# Patient Record
Sex: Female | Born: 1965
Health system: Southern US, Community
[De-identification: ages and names within clinical notes are randomized; demographics above are authoritative.]

## PROBLEM LIST (undated history)

## (undated) DIAGNOSIS — F32A Depression, unspecified: Secondary | ICD-10-CM

## (undated) DIAGNOSIS — L940 Localized scleroderma [morphea]: Secondary | ICD-10-CM

## (undated) DIAGNOSIS — I10 Essential (primary) hypertension: Secondary | ICD-10-CM

## (undated) HISTORY — PX: BREAST REDUCTION SURGERY: SHX8

## (undated) HISTORY — PX: REDUCTION MAMMAPLASTY: SUR839

---

## 2020-05-22 DIAGNOSIS — S61211A Laceration without foreign body of left index finger without damage to nail, initial encounter: Secondary | ICD-10-CM | POA: Diagnosis not present

## 2020-05-28 DIAGNOSIS — S61211A Laceration without foreign body of left index finger without damage to nail, initial encounter: Secondary | ICD-10-CM | POA: Diagnosis not present

## 2020-05-28 DIAGNOSIS — L03012 Cellulitis of left finger: Secondary | ICD-10-CM | POA: Diagnosis not present

## 2020-05-30 ENCOUNTER — Other Ambulatory Visit: Payer: Self-pay

## 2020-05-30 ENCOUNTER — Encounter (HOSPITAL_COMMUNITY): Payer: Self-pay | Admitting: Emergency Medicine

## 2020-05-30 ENCOUNTER — Ambulatory Visit (HOSPITAL_COMMUNITY)
Admission: EM | Admit: 2020-05-30 | Discharge: 2020-05-30 | Disposition: A | Discharge status: Home / Self Care | Payer: BLUE CROSS/BLUE SHIELD

## 2020-05-30 DIAGNOSIS — L089 Local infection of the skin and subcutaneous tissue, unspecified: Secondary | ICD-10-CM

## 2020-05-30 HISTORY — DX: Depression, unspecified: F32.A

## 2020-05-30 HISTORY — DX: Essential (primary) hypertension: I10

## 2020-05-30 HISTORY — DX: Localized scleroderma (morphea): L94.0

## 2020-05-30 MED ORDER — CEFTRIAXONE SODIUM 500 MG IJ SOLR
500.0000 mg | Freq: Once | INTRAMUSCULAR | Status: AC
  Administered 2020-05-30: 500 mg via INTRAMUSCULAR

## 2020-05-30 MED ORDER — AMOXICILLIN-POT CLAVULANATE 875-125 MG PO TABS: 1.0000 | ORAL_TABLET | Freq: Two times a day (BID) | ORAL | 0 refills | Status: DC

## 2020-05-30 MED ORDER — LIDOCAINE HCL (PF) 1 % IJ SOLN
INTRAMUSCULAR | Status: AC
  Filled 2020-05-30: qty 2

## 2020-05-30 MED ORDER — CEFTRIAXONE SODIUM 500 MG IJ SOLR
INTRAMUSCULAR | Status: AC
  Filled 2020-05-30: qty 500

## 2020-05-30 NOTE — ED Notes (Signed)
Left index finger with sutures.  Finger is red, swollen and warm to touch.   Has completed bactrim and now on doxycycline.

## 2020-05-30 NOTE — ED Triage Notes (Signed)
Pt states she had a left index finger laceration on 10/6. She had stitches placed and was put on bactrim. She states she went to another urgent care on Tuesday and was giving doxycycline. She states her finger is draining pus and it is swollen.

## 2020-05-30 NOTE — ED Provider Notes (Signed)
[facility]    CSN: [id number] Arrival date & time: 05/30/20  1012      History   Chief Complaint Chief Complaint  Patient presents with  . finger infection    HPI Sherry Hansen is a 54 y.o. female.   Patient is a 54 year old female who presents today for left index finger infection.  Had laceration on 10/6 and had stitches.  Was placed on Bactrim and finished full course of that.  She has had continued and worsening redness, swelling.  She has had purulent drainage from the site.  Tuesday went to another urgent care was started on doxycycline but has seen no improvement.  No fevers, chills.     Past Medical History:  Diagnosis Date  . Depression   . Hypertension   . Scleroderma, localized     There are no problems to display for this patient.   Past Surgical History:  Procedure Laterality Date  . BREAST REDUCTION SURGERY      OB History   No obstetric history on file.      Home Medications    Prior to Admission medications   Medication Sig Start Date End Date Taking? Authorizing Provider  amLODipine (NORVASC) 10 MG tablet Take 10 mg by mouth daily.   Yes [provider]  busPIRone (BUSPAR) 30 MG tablet Take 45 mg by mouth in the morning and at bedtime.   Yes [provider]  estradiol (CLIMARA - DOSED IN MG/24 HR) 0.1 mg/24hr patch Place 0.1 mg onto the skin once a week.   Yes [provider]  loratadine (CLARITIN) 10 MG tablet Take 10 mg by mouth daily.   Yes [provider]  Venlafaxine HCl 150 MG TB24 Take 300 mg by mouth daily.   Yes [provider]  amoxicillin-clavulanate (AUGMENTIN) 875-125 MG tablet Take 1 tablet by mouth every 12 (twelve) hours. 05/30/20   Janace Aris, NP    Family History No family history on file.  Social History Social History   Tobacco Use  . Smoking status: Never Smoker  . Smokeless tobacco: Never Used  Vaping Use  . Vaping Use: Never used    Substance Use Topics  . Alcohol use: Not Currently  . Drug use: Not on file     Allergies   Nsaids   Review of Systems Review of Systems   Physical Exam Triage Vital Signs ED Triage Vitals  Enc Vitals Group     BP 05/30/20 1051 (!) 150/94     Pulse Rate 05/30/20 1050 94     Resp 05/30/20 1050 (!) 21     Temp 05/30/20 1050 98.4 F (36.9 C)     Temp Source 05/30/20 1050 Oral     SpO2 05/30/20 1050 99 %     Weight --      Height --      Head Circumference --      Peak Flow --      Pain Score 05/30/20 1051 0     Pain Loc --      Pain Edu? --      Excl. in GC? --    No data found.  Updated Vital Signs BP (!) 150/94   Pulse 94   Temp 98.4 F (36.9 C) (Oral)   Resp (!) 21   SpO2 99%   Visual Acuity Right Eye Distance:   Left Eye Distance:   Bilateral Distance:    Right Eye Near:   Left Eye  Near:    Bilateral Near:     Physical Exam Vitals and nursing note reviewed.  Constitutional:      General: She is not in acute distress.    Appearance: Normal appearance. She is not ill-appearing, toxic-appearing or diaphoretic.  HENT:     Head: Normocephalic.     Nose: Nose normal.  Eyes:     Conjunctiva/sclera: Conjunctivae normal.  Pulmonary:     Effort: Pulmonary effort is normal.  Musculoskeletal:        General: Normal range of motion.     Cervical back: Normal range of motion.     Comments: Left index finger laceration with stitches in place.  There is significant redness, swelling to the finger with purulent drainage from incision Limited range of motion tender to touch.  Skin:    General: Skin is warm and dry.     Findings: No rash.  Neurological:     Mental Status: She is alert.  Psychiatric:        Mood and Affect: Mood normal.      UC Treatments / Results  Labs (all labs ordered are listed, but only abnormal results are displayed) Labs Reviewed - No data to display  EKG   Radiology No results found.  Procedures Procedures  (including critical care time)  Medications Ordered in UC Medications  cefTRIAXone (ROCEPHIN) injection 500 mg (500 mg Intramuscular Given 05/30/20 1141)    Initial Impression / Assessment and Plan / UC Course  I have reviewed the triage vital signs and the nursing notes.  Pertinent labs & imaging results that were available during my care of the patient were reviewed by me and considered in my medical decision making (see chart for details).     Finger laceration and infection Patient with active infection.  Patient already been treated with 1 week of Bactrim and has had 2 doses of doxycycline and symptoms are not improved at all. Will trial amoxicillin and Rocephin injection here in clinic for better strep coverage Close monitoring over the next 24 hours.  Follow up if not better.   Final Clinical Impressions(s) / UC Diagnoses   Final diagnoses:  Finger infection  Wound infection     Discharge Instructions     Medicine as prescribed  Follow up as needed for continued or worsening symptoms     ED Prescriptions    Medication Sig Dispense Auth. Provider   amoxicillin-clavulanate (AUGMENTIN) 875-125 MG tablet Take 1 tablet by mouth every 12 (twelve) hours. 14 tablet Analea Muller A, NP     PDMP not reviewed this encounter.   Janace Aris, NP 05/31/20 [phone]

## 2020-05-30 NOTE — Discharge Instructions (Addendum)
Medicine as prescribed °Follow up as needed for continued or worsening symptoms ° °

## 2020-06-05 ENCOUNTER — Ambulatory Visit: Payer: Self-pay | Admitting: *Deleted

## 2020-06-05 NOTE — Telephone Encounter (Signed)
1st finger on left hand had a cut from just over 1 month ago. Initially required 5 stitches.Was given antibiotic Amoxil 875-125 mg on 05/30/20 at the UC and stitches were removed at that time.   Swelling is reduced 75% today. Still red where the stitches were removed but no further out. Edges are closed with scabbing and no drainage.Somewhat warm to touch. No systemic fever. Completes Amoxil 875-125 mg tonight. Symptoms requiring additional evaluation discussed.Advised to complete abx and keep area clean and dry. Provided pcp office and number for future new patient appointment.

## 2020-06-12 ENCOUNTER — Ambulatory Visit (HOSPITAL_COMMUNITY)
Admission: EM | Admit: 2020-06-12 | Discharge: 2020-06-12 | Disposition: A | Discharge status: Home / Self Care | Payer: BLUE CROSS/BLUE SHIELD | Attending: Family Medicine | Admitting: Family Medicine

## 2020-06-12 ENCOUNTER — Encounter (HOSPITAL_COMMUNITY): Payer: Self-pay | Admitting: Emergency Medicine

## 2020-06-12 ENCOUNTER — Other Ambulatory Visit: Payer: Self-pay

## 2020-06-12 DIAGNOSIS — N649 Disorder of breast, unspecified: Secondary | ICD-10-CM | POA: Diagnosis not present

## 2020-06-12 DIAGNOSIS — L988 Other specified disorders of the skin and subcutaneous tissue: Secondary | ICD-10-CM

## 2020-06-12 MED ORDER — NYSTATIN 100000 UNIT/GM EX CREA: TOPICAL_CREAM | CUTANEOUS | 0 refills | Status: DC

## 2020-06-12 MED ORDER — BACITRACIN ZINC 500 UNIT/GM EX OINT
TOPICAL_OINTMENT | CUTANEOUS | Status: AC
  Filled 2020-06-12: qty 0.9

## 2020-06-12 MED ORDER — AMOXICILLIN-POT CLAVULANATE 875-125 MG PO TABS: 1.0000 | ORAL_TABLET | Freq: Two times a day (BID) | ORAL | 0 refills | Status: DC

## 2020-06-12 NOTE — ED Triage Notes (Signed)
Pt states she had an irritation under her left breast x 1 week. Pt states she just finished her antibiotics about a week ago and is concerned it may have come from the antibiotics.

## 2020-06-12 NOTE — ED Provider Notes (Signed)
[facility]    CSN: [id number] Arrival date & time: 06/12/20  1025      History   Chief Complaint Chief Complaint  Patient presents with  . Rash    HPI Sherry Hansen is a 54 y.o. female.   Patient presenting today with several days of worsening blistering and irritation under breasts, worst under right breast. Notes she was just on 4 antibiotics the past month or so for a finger infection but otherwise has done nothing out of the ordinary prior to onset. Mild soreness and itching to area, no swelling, drainage, fever, chills. Hx of scleroderma so lots of scarring in this area already. Trying to keep it clean and dry but otherwise not trying anything OTC for it.      Past Medical History:  Diagnosis Date  . Depression   . Hypertension   . Scleroderma, localized     There are no problems to display for this patient.   Past Surgical History:  Procedure Laterality Date  . BREAST REDUCTION SURGERY      OB History   No obstetric history on file.      Home Medications    Prior to Admission medications   Medication Sig Start Date End Date Taking? Authorizing Provider  amLODipine (NORVASC) 10 MG tablet Take 10 mg by mouth daily.   Yes [provider]  busPIRone (BUSPAR) 30 MG tablet Take 45 mg by mouth in the morning and at bedtime.   Yes [provider]  estradiol (CLIMARA - DOSED IN MG/24 HR) 0.1 mg/24hr patch Place 0.1 mg onto the skin once a week.   Yes [provider]  loratadine (CLARITIN) 10 MG tablet Take 10 mg by mouth daily.   Yes [provider]  Venlafaxine HCl 150 MG TB24 Take 300 mg by mouth daily.   Yes [provider]  amoxicillin-clavulanate (AUGMENTIN) 875-125 MG tablet Take 1 tablet by mouth every 12 (twelve) hours. 06/12/20   Particia Nearing, PA-C  nystatin cream (MYCOSTATIN) Apply to affected area 2 times daily 06/12/20   Particia Nearing, PA-C    Family  History History reviewed. No pertinent family history.  Social History Social History   Tobacco Use  . Smoking status: Never Smoker  . Smokeless tobacco: Never Used  Vaping Use  . Vaping Use: Never used  Substance Use Topics  . Alcohol use: Not Currently  . Drug use: Not on file     Allergies   Nsaids   Review of Systems Review of Systems PER HPI    Physical Exam Triage Vital Signs ED Triage Vitals  Enc Vitals Group     BP 06/12/20 1125 (!) 155/103     Pulse Rate 06/12/20 1125 93     Resp 06/12/20 1125 (!) 21     Temp 06/12/20 1125 98.4 F (36.9 C)     Temp Source 06/12/20 1125 Oral     SpO2 06/12/20 1125 100 %     Weight --      Height --      Head Circumference --      Peak Flow --      Pain Score 06/12/20 1122 2     Pain Loc --      Pain Edu? --      Excl. in GC? --    No data found.  Updated Vital Signs BP (!) 143/89 (BP Location: Right Arm)   Pulse 93   Temp 98.4 F (36.9  C) (Oral)   Resp (!) 21   SpO2 100%   Visual Acuity Right Eye Distance:   Left Eye Distance:   Bilateral Distance:    Right Eye Near:   Left Eye Near:    Bilateral Near:     Physical Exam Vitals and nursing note reviewed.  Constitutional:      Appearance: Normal appearance. She is not ill-appearing.  HENT:     Head: Atraumatic.  Eyes:     Extraocular Movements: Extraocular movements intact.     Conjunctiva/sclera: Conjunctivae normal.  Cardiovascular:     Rate and Rhythm: Normal rate and regular rhythm.     Heart sounds: Normal heart sounds.  Pulmonary:     Effort: Pulmonary effort is normal.     Breath sounds: Normal breath sounds.  Musculoskeletal:        General: Normal range of motion.     Cervical back: Normal range of motion and neck supple.  Skin:    General: Skin is warm.     Comments: Large patches of scarring from scleroderma, and then some areas of early blistering/irritation under b/l breasts with some superficial ulcerations forming under right  breast with granulation tissue present in centers  Neurological:     Mental Status: She is alert and oriented to person, place, and time.  Psychiatric:        Mood and Affect: Mood normal.        Thought Content: Thought content normal.        Judgment: Judgment normal.    UC Treatments / Results  Labs (all labs ordered are listed, but only abnormal results are displayed) Labs Reviewed - No data to display  EKG   Radiology No results found.  Procedures Procedures (including critical care time)  Medications Ordered in UC Medications - No data to display  Initial Impression / Assessment and Plan / UC Course  I have reviewed the triage vital signs and the nursing notes.  Pertinent labs & imaging results that were available during my care of the patient were reviewed by me and considered in my medical decision making (see chart for details).     Will tx with neosporin and nystatin cream, keep clean and covered. Tegaderm and nonstick gauze used to dress ulcerations today in clinic. If ulcerations worsening may start augmentin though hoping to avoid that as she has recently been on 4 different antibiotics for a finger infection. Return precautions given.  Final Clinical Impressions(s) / UC Diagnoses   Final diagnoses:  Skin lesion of breast   Discharge Instructions   None    ED Prescriptions    Medication Sig Dispense Auth. Provider   amoxicillin-clavulanate (AUGMENTIN) 875-125 MG tablet Take 1 tablet by mouth every 12 (twelve) hours. 14 tablet Roosvelt Maser [place], [place]   nystatin cream (MYCOSTATIN) Apply to affected area 2 times daily 30 g Particia Nearing, [place]     PDMP not reviewed this encounter.   Particia Nearing, [place] 06/12/20 2143

## 2020-07-23 ENCOUNTER — Ambulatory Visit
Admission: EM | Admit: 2020-07-23 | Discharge: 2020-07-23 | Disposition: A | Discharge status: Home / Self Care | Payer: BLUE CROSS/BLUE SHIELD | Attending: Internal Medicine | Admitting: Internal Medicine

## 2020-07-23 ENCOUNTER — Other Ambulatory Visit: Payer: Self-pay

## 2020-07-23 DIAGNOSIS — N898 Other specified noninflammatory disorders of vagina: Secondary | ICD-10-CM | POA: Diagnosis not present

## 2020-07-23 DIAGNOSIS — Z79899 Other long term (current) drug therapy: Secondary | ICD-10-CM | POA: Insufficient documentation

## 2020-07-23 DIAGNOSIS — N760 Acute vaginitis: Secondary | ICD-10-CM | POA: Insufficient documentation

## 2020-07-23 LAB — POCT URINALYSIS DIP (MANUAL ENTRY)
Bilirubin, UA: NEGATIVE
Glucose, UA: NEGATIVE mg/dL
Ketones, POC UA: NEGATIVE mg/dL
Nitrite, UA: NEGATIVE
Protein Ur, POC: NEGATIVE mg/dL
Spec Grav, UA: 1.015 (ref 1.010–1.025)
Urobilinogen, UA: 0.2 E.U./dL
pH, UA: 6 (ref 5.0–8.0)

## 2020-07-23 MED ORDER — FLUCONAZOLE 150 MG PO TABS: 150.0000 mg | ORAL_TABLET | Freq: Once | ORAL | 0 refills | Status: AC

## 2020-07-23 NOTE — ED Provider Notes (Signed)
[facility]    CSN: [id number] Arrival date & time: 07/23/20  1410      History   Chief Complaint No chief complaint on file.   HPI Sherry Hansen is a 54 y.o. female comes to the urgent care with complaints of vaginal burning over the past few days.  Patient was recently treated for vaginal yeast infection with Monistat.  Patient had partial relief after using the Monistat.  She started complaining about vaginal burning a few days ago.  Is associated with some frequency of urination but no significant dysuria.  Patient denies any lower abdominal pain.  No vaginal discharge noted.  No odor noted.  Patient denied any rash or itching in the vaginal area.  She is not sexually active at this time.   HPI  Past Medical History:  Diagnosis Date  . Depression   . Hypertension   . Scleroderma, localized     There are no problems to display for this patient.   Past Surgical History:  Procedure Laterality Date  . BREAST REDUCTION SURGERY      OB History   No obstetric history on file.      Home Medications    Prior to Admission medications   Medication Sig Start Date End Date Taking? Authorizing Provider  amLODipine (NORVASC) 10 MG tablet Take 10 mg by mouth daily.    [provider]  busPIRone (BUSPAR) 30 MG tablet Take 45 mg by mouth in the morning and at bedtime.    [provider]  estradiol (CLIMARA - DOSED IN MG/24 HR) 0.1 mg/24hr patch Place 0.1 mg onto the skin once a week.    [provider]  fluconazole (DIFLUCAN) 150 MG tablet Take 1 tablet (150 mg total) by mouth once for 1 dose. Please take the second dose after 72 hours if no improvement in symptoms 07/23/20 07/23/20  Merrilee Jansky, MD  loratadine (CLARITIN) 10 MG tablet Take 10 mg by mouth daily.    [provider]  nystatin cream (MYCOSTATIN) Apply to affected area 2 times daily 06/12/20   Particia Nearing, PA-C  Venlafaxine HCl 150 MG TB24 Take  300 mg by mouth daily.    [provider]    Family History Family History  Problem Relation Age of Onset  . Cancer Father     Social History Social History   Tobacco Use  . Smoking status: Never Smoker  . Smokeless tobacco: Never Used  Vaping Use  . Vaping Use: Never used  Substance Use Topics  . Alcohol use: Not Currently  . Drug use: Not on file     Allergies   Nsaids   Review of Systems Review of Systems  Constitutional: Negative.   HENT: Negative.   Gastrointestinal: Negative.  Negative for abdominal pain.  Genitourinary: Positive for frequency, urgency and vaginal pain. Negative for dysuria, vaginal bleeding and vaginal discharge.  Musculoskeletal: Negative.   Neurological: Negative.      Physical Exam Triage Vital Signs ED Triage Vitals  Enc Vitals Group     BP 07/23/20 1520 (!) 156/106     Pulse Rate 07/23/20 1520 96     Resp 07/23/20 1520 20     Temp 07/23/20 1520 97.9 F (36.6 C)     Temp src --      SpO2 07/23/20 1520 97 %     Weight --      Height --      Head Circumference --  Peak Flow --      Pain Score 07/23/20 1518 0     Pain Loc --      Pain Edu? --      Excl. in GC? --    No data found.  Updated Vital Signs BP (!) 156/106   Pulse 96   Temp 97.9 F (36.6 C)   Resp 20   SpO2 97%   Visual Acuity Right Eye Distance:   Left Eye Distance:   Bilateral Distance:    Right Eye Near:   Left Eye Near:    Bilateral Near:     Physical Exam Vitals and nursing note reviewed.  Constitutional:      General: She is not in acute distress.    Appearance: She is not ill-appearing.  Cardiovascular:     Rate and Rhythm: Normal rate and regular rhythm.     Pulses: Normal pulses.     Heart sounds: Normal heart sounds.  Pulmonary:     Effort: Pulmonary effort is normal.     Breath sounds: Normal breath sounds.  Abdominal:     General: Bowel sounds are normal.     Palpations: Abdomen is soft.  Neurological:     Mental  Status: She is alert.      UC Treatments / Results  Labs (all labs ordered are listed, but only abnormal results are displayed) Labs Reviewed  POCT URINALYSIS DIP (MANUAL ENTRY) - Abnormal; Notable for the following components:      Result Value   Blood, UA small (*)    Leukocytes, UA Small (1+) (*)    All other components within normal limits  URINE CULTURE  CERVICOVAGINAL ANCILLARY ONLY    EKG   Radiology No results found.  Procedures Procedures (including critical care time)  Medications Ordered in UC Medications - No data to display  Initial Impression / Assessment and Plan / UC Course  I have reviewed the triage vital signs and the nursing notes.  Pertinent labs & imaging results that were available during my care of the patient were reviewed by me and considered in my medical decision making (see chart for details).     1.  Acute vaginitis Point-of-care urinalysis showed small hemoglobin and leukocyte esterase Cervicovaginal swab for BV and yeast Fluconazole 150 mg orally x1 dose Treatment regimen will be updated if the cervicovaginal swab is abnormal. Return precautions given Final Clinical Impressions(s) / UC Diagnoses   Final diagnoses:  Vaginitis and vulvovaginitis     Discharge Instructions     Please sign up for MyChart to review your results If your labs are abnormal we will call you with recommendations.    ED Prescriptions    Medication Sig Dispense Auth. Provider   fluconazole (DIFLUCAN) 150 MG tablet Take 1 tablet (150 mg total) by mouth once for 1 dose. Please take the second dose after 72 hours if no improvement in symptoms 2 tablet Lamptey, Britta Mccreedy, MD     PDMP not reviewed this encounter.   Merrilee Jansky, MD 07/23/20 [phone]

## 2020-07-23 NOTE — Discharge Instructions (Signed)
Please sign up for MyChart to review your results If your labs are abnormal we will call you with recommendations.

## 2020-07-23 NOTE — ED Triage Notes (Signed)
Pt states she usually has frequent yeast infections however she wants to make sure irritation isn't coming from uti , pt states she has used over the counter medication for yeast with no relief

## 2020-07-25 LAB — CERVICOVAGINAL ANCILLARY ONLY
Bacterial Vaginitis (gardnerella): NEGATIVE
Candida Glabrata: NEGATIVE
Candida Vaginitis: NEGATIVE
Comment: NEGATIVE
Comment: NEGATIVE
Comment: NEGATIVE

## 2020-07-25 LAB — URINE CULTURE: Culture: >=100000 — AB

## 2020-08-21 ENCOUNTER — Ambulatory Visit: Payer: BLUE CROSS/BLUE SHIELD | Admitting: Nurse Practitioner

## 2020-11-28 ENCOUNTER — Ambulatory Visit (HOSPITAL_COMMUNITY)
Admission: EM | Admit: 2020-11-28 | Discharge: 2020-11-28 | Disposition: A | Discharge status: Home / Self Care | Payer: BLUE CROSS/BLUE SHIELD | Attending: Family Medicine | Admitting: Family Medicine

## 2020-11-28 ENCOUNTER — Encounter (HOSPITAL_COMMUNITY): Payer: Self-pay

## 2020-11-28 ENCOUNTER — Other Ambulatory Visit: Payer: Self-pay

## 2020-11-28 DIAGNOSIS — N611 Abscess of the breast and nipple: Secondary | ICD-10-CM

## 2020-11-28 DIAGNOSIS — L239 Allergic contact dermatitis, unspecified cause: Secondary | ICD-10-CM | POA: Diagnosis not present

## 2020-11-28 MED ORDER — DOXYCYCLINE HYCLATE 100 MG PO TABS: 100.0000 mg | ORAL_TABLET | Freq: Two times a day (BID) | ORAL | 0 refills | Status: DC

## 2020-11-28 MED ORDER — TRIAMCINOLONE ACETONIDE 0.1 % EX CREA: 1.0000 "application " | TOPICAL_CREAM | Freq: Two times a day (BID) | CUTANEOUS | 0 refills | Status: DC

## 2020-11-28 MED ORDER — HIBICLENS 4 % EX LIQD: Freq: Every day | CUTANEOUS | 0 refills | Status: DC | PRN

## 2020-11-28 NOTE — ED Triage Notes (Signed)
Pt presents with itchiness, redness and pain in the left breast x 3 weeks. Reports having discharge from the sore.

## 2020-11-28 NOTE — ED Provider Notes (Signed)
[facility]    CSN: [id number] Arrival date & time: 11/28/20  1031      History   Chief Complaint Chief Complaint  Patient presents with  . Recurrent Skin Infections    HPI Sherry Hansen is a 55 y.o. female.   Patient presenting today with 3-week history of worsening draining abscess left breast.  She states she has been treating with Neosporin, wound cleaner, keeping covered with Tegaderm and thought it was getting better at first but now it is gone significantly worse in the last 3 days.  Draining thick yellow discharge.  Denies fever, chills, body aches, sweats.  Also having itchy rash where the Tegaderm adhesive has been reacting to her skin surrounding the wound.  History of similar issue on the opposite side last year.  Does have history of scleroderma.     Past Medical History:  Diagnosis Date  . Depression   . Hypertension   . Scleroderma, localized     There are no problems to display for this patient.   Past Surgical History:  Procedure Laterality Date  . BREAST REDUCTION SURGERY      OB History   No obstetric history on file.      Home Medications    Prior to Admission medications   Medication Sig Start Date End Date Taking? Authorizing Provider  chlorhexidine (HIBICLENS) 4 % external liquid Apply topically daily as needed. 11/28/20  Yes Particia Nearing, PA-C  doxycycline (VIBRA-TABS) 100 MG tablet Take 1 tablet (100 mg total) by mouth 2 (two) times daily. 11/28/20  Yes Particia Nearing, PA-C  triamcinolone cream (KENALOG) 0.1 % Apply 1 application topically 2 (two) times daily. 11/28/20  Yes Particia Nearing, PA-C  amLODipine (NORVASC) 10 MG tablet Take 10 mg by mouth daily.    [provider]  busPIRone (BUSPAR) 30 MG tablet Take 45 mg by mouth in the morning and at bedtime.    [provider]  estradiol (CLIMARA - DOSED IN MG/24 HR) 0.1 mg/24hr patch Place 0.1 mg onto the skin once a week.     [provider]  loratadine (CLARITIN) 10 MG tablet Take 10 mg by mouth daily.    [provider]  nystatin cream (MYCOSTATIN) Apply to affected area 2 times daily 06/12/20   Particia Nearing, PA-C  Venlafaxine HCl 150 MG TB24 Take 300 mg by mouth daily.    [provider]    Family History Family History  Problem Relation Age of Onset  . Cancer Father     Social History Social History   Tobacco Use  . Smoking status: Never Smoker  . Smokeless tobacco: Never Used  Vaping Use  . Vaping Use: Never used  Substance Use Topics  . Alcohol use: Not Currently     Allergies   Nsaids   Review of Systems Review of Systems Per HPI Physical Exam Triage Vital Signs ED Triage Vitals [11/28/20 1146]  Enc Vitals Group     BP (!) 152/99     Pulse Rate 91     Resp 20     Temp 98.9 F (37.2 C)     Temp Source Oral     SpO2 100 %     Weight      Height      Head Circumference      Peak Flow      Pain Score      Pain Loc      Pain Edu?  Excl. in GC?    No data found.  Updated Vital Signs BP (!) 152/99 (BP Location: Right Arm)   Pulse 91   Temp 98.9 F (37.2 C) (Oral)   Resp 20   SpO2 100%   Visual Acuity Right Eye Distance:   Left Eye Distance:   Bilateral Distance:    Right Eye Near:   Left Eye Near:    Bilateral Near:     Physical Exam Vitals and nursing note reviewed.  Constitutional:      Appearance: Normal appearance. She is not ill-appearing.  HENT:     Head: Atraumatic.     Nose: Nose normal.     Mouth/Throat:     Mouth: Mucous membranes are moist.     Pharynx: Oropharynx is clear.  Eyes:     Extraocular Movements: Extraocular movements intact.     Conjunctiva/sclera: Conjunctivae normal.  Cardiovascular:     Rate and Rhythm: Normal rate and regular rhythm.     Heart sounds: Normal heart sounds.  Pulmonary:     Effort: Pulmonary effort is normal.     Breath sounds: Normal breath sounds.  Musculoskeletal:         General: Normal range of motion.     Cervical back: Normal range of motion and neck supple.  Skin:    General: Skin is warm.     Comments: 1 cm x 2 cm oblong firm spontaneously draining abscess left inferior lateral breast Erythematous maculopapular rash along periphery where Tegaderm adhesive has been  Neurological:     Mental Status: She is alert and oriented to person, place, and time.  Psychiatric:        Mood and Affect: Mood normal.        Thought Content: Thought content normal.        Judgment: Judgment normal.      UC Treatments / Results  Labs (all labs ordered are listed, but only abnormal results are displayed) Labs Reviewed - No data to display  EKG   Radiology No results found.  Procedures Procedures (including critical care time)  Medications Ordered in UC Medications - No data to display  Initial Impression / Assessment and Plan / UC Course  I have reviewed the triage vital signs and the nursing notes.  Pertinent labs & imaging results that were available during my care of the patient were reviewed by me and considered in my medical decision making (see chart for details).     Area spontaneously draining and otherwise firm so we will start 10-day once course of doxycycline, Hibiclens, keep covered with Neosporin.  We will also give triamcinolone for allergic dermatitis surrounding wound where the adhesive has reacted with her skin.  Return for wound check if not fully resolving.  Dates she is establishing care with primary care next week and will let them recheck it as well.  Final Clinical Impressions(s) / UC Diagnoses   Final diagnoses:  Breast abscess  Allergic dermatitis   Discharge Instructions   None    ED Prescriptions    Medication Sig Dispense Auth. Provider   doxycycline (VIBRA-TABS) 100 MG tablet Take 1 tablet (100 mg total) by mouth 2 (two) times daily. 20 tablet Particia Nearing, [place]   chlorhexidine (HIBICLENS) 4 %  external liquid Apply topically daily as needed. 120 mL Particia Nearing, [place]   triamcinolone cream (KENALOG) 0.1 % Apply 1 application topically 2 (two) times daily. 100 g Particia Nearing, [place]  PDMP not reviewed this encounter.   Particia Nearing, [place] 11/28/20 1222

## 2020-12-04 ENCOUNTER — Encounter: Payer: Self-pay | Admitting: Nurse Practitioner

## 2020-12-04 ENCOUNTER — Other Ambulatory Visit: Payer: Self-pay | Admitting: Nurse Practitioner

## 2020-12-04 ENCOUNTER — Other Ambulatory Visit: Payer: Self-pay

## 2020-12-04 ENCOUNTER — Ambulatory Visit (INDEPENDENT_AMBULATORY_CARE_PROVIDER_SITE_OTHER): Payer: BLUE CROSS/BLUE SHIELD | Admitting: Nurse Practitioner

## 2020-12-04 VITALS — BP 130/90 | HR 107 | Temp 98.1°F | Ht 63.75 in | Wt 233.8 lb

## 2020-12-04 DIAGNOSIS — N6459 Other signs and symptoms in breast: Secondary | ICD-10-CM

## 2020-12-04 DIAGNOSIS — F411 Generalized anxiety disorder: Secondary | ICD-10-CM

## 2020-12-04 DIAGNOSIS — Z13220 Encounter for screening for lipoid disorders: Secondary | ICD-10-CM | POA: Diagnosis not present

## 2020-12-04 DIAGNOSIS — Z780 Asymptomatic menopausal state: Secondary | ICD-10-CM

## 2020-12-04 DIAGNOSIS — Z136 Encounter for screening for cardiovascular disorders: Secondary | ICD-10-CM

## 2020-12-04 DIAGNOSIS — I10 Essential (primary) hypertension: Secondary | ICD-10-CM | POA: Insufficient documentation

## 2020-12-04 DIAGNOSIS — Z1231 Encounter for screening mammogram for malignant neoplasm of breast: Secondary | ICD-10-CM

## 2020-12-04 MED ORDER — BUSPIRONE HCL 30 MG PO TABS: 45.0000 mg | ORAL_TABLET | Freq: Two times a day (BID) | ORAL | 0 refills | Status: DC

## 2020-12-04 MED ORDER — AMLODIPINE BESYLATE 10 MG PO TABS: 10.0000 mg | ORAL_TABLET | Freq: Every day | ORAL | 0 refills | Status: DC

## 2020-12-04 MED ORDER — VENLAFAXINE HCL ER 150 MG PO TB24: 300.0000 mg | ORAL_TABLET | Freq: Every day | ORAL | 0 refills | Status: DC

## 2020-12-04 MED ORDER — ESTRADIOL 0.1 MG/24HR TD PTWK: 0.1000 mg | MEDICATED_PATCH | TRANSDERMAL | 0 refills | Status: DC

## 2020-12-04 NOTE — Patient Instructions (Addendum)
Sign medical release forms to get records from previous pcp and GYN.  Go to [address] for fasting blood draw You need to be fasting 6-8hrs prior to blood draw, ok to drink water.  You will be contacted to schedule appt for repeat mammogram  Thank you for choosing [facility]

## 2020-12-04 NOTE — Assessment & Plan Note (Signed)
Reports hx of scleroderma: diagnosed by pcp in [place] and dermatology in [place] via blood draw and presentation. No previous skin biopsy. Has scarring under her breast and abdominal wall. No SOB or joint pain or muscle pain or digital ulceration or joint deformity She is requesting for referral to [facility]: Dr. Durenda Age  Advised her about need for previous records from pcp and dermatology prior to rheumatology referral. She is to sign medical release form today. Entered urgent dermatology.

## 2020-12-04 NOTE — Progress Notes (Signed)
Subjective:  Patient ID: Sherry Hansen, female    DOB: 1966/03/25  Age: 55 y.o. MRN: [id number]  CC: Establish Care (NP here for CPE.  Pt not UTD on mammogram or colonoscopy.  Pt is UTD on pap, 12/20 and also on Tdap.  Pt is not fasting for labs today, she would like to discuss last visit to UC, from last Thursday.  Pt will sign a ROR when checking out today.)  HPI  Thickening of skin of breast Reports hx of scleroderma: diagnosed by pcp in [place] and dermatology in [place] via blood draw and presentation. No previous skin biopsy. Has scarring under her breast and abdominal wall. No SOB or joint pain or muscle pain or digital ulceration or joint deformity She is requesting for referral to [facility]: Dr. Durenda Age  Advised her about need for previous records from pcp and dermatology prior to rheumatology referral. She is to sign medical release form today. Entered urgent dermatology.   GAD (generalized anxiety disorder) Stable with effexor and buspar. Refill sent  Primary hypertension BP at goal with amlodipine Refill sent. BP Readings from Last 3 Encounters:  12/04/20 130/90  11/28/20 (!) 152/99  07/23/20 (!) 156/106    Postmenopausal estrogen deficiency Current use of estrogen patch in last 32yrs Per patient: menopausal at age 52 FHx of breast cancer: maternal aunts (42s) and 1st cousin (30s). No previous genetic testing Reports last PAP 1.71yrs ago (normal, no hx of abnormal) She is overdue for annual mammogram: order entered today. Advised to sign medical release form to get records from previous GYN. She understands the risk of cardiovascular event, DVT/PE, and cancer (breast and ovarian)    Reviewed past Medical, Social and Family history today.  Outpatient Medications Prior to Visit  Medication Sig Dispense Refill  . chlorhexidine (HIBICLENS) 4 % external liquid Apply topically daily as needed. 120 mL 0  . doxycycline (VIBRA-TABS) 100 MG  tablet Take 1 tablet (100 mg total) by mouth 2 (two) times daily. 20 tablet 0  . loratadine (CLARITIN) 10 MG tablet Take 10 mg by mouth daily.    Marland Kitchen nystatin cream (MYCOSTATIN) Apply to affected area 2 times daily 30 g 0  . triamcinolone cream (KENALOG) 0.1 % Apply 1 application topically 2 (two) times daily. 100 g 0  . amLODipine (NORVASC) 10 MG tablet Take 10 mg by mouth daily.    . busPIRone (BUSPAR) 30 MG tablet Take 45 mg by mouth in the morning and at bedtime.    Marland Kitchen estradiol (CLIMARA - DOSED IN MG/24 HR) 0.1 mg/24hr patch Place 0.1 mg onto the skin once a week.    . Venlafaxine HCl 150 MG TB24 Take 300 mg by mouth daily.     No facility-administered medications prior to visit.    ROS See HPI  Objective:  BP 130/90 (BP Location: Left Arm, Patient Position: Sitting, Cuff Size: Normal)   Pulse (!) 107   Temp 98.1 F (36.7 C) (Oral)   Ht 5' 3.75" (1.619 m)   Wt 233 lb 12.8 oz (106.1 kg)   SpO2 97%   BMI 40.45 kg/m   Physical Exam Vitals reviewed.  Cardiovascular:     Rate and Rhythm: Normal rate and regular rhythm.     Pulses: Normal pulses.     Heart sounds: Normal heart sounds.  Pulmonary:     Effort: Pulmonary effort is normal.     Breath sounds: Normal breath sounds.  Skin:    Findings: No erythema.  Neurological:     Mental Status: She is alert and oriented to person, place, and time.    Assessment & Plan:  This visit occurred during the SARS-CoV-2 public health emergency.  Safety protocols were in place, including screening questions prior to the visit, additional usage of staff PPE, and extensive cleaning of exam room while observing appropriate contact time as indicated for disinfecting solutions.   Lenix was seen today for establish care.  Diagnoses and all orders for this visit:  Thickening of skin of breast -     CBC with Differential/Platelet; Future -     Comprehensive metabolic panel; Future -     Ambulatory referral to  Dermatology  Encounter for lipid screening for cardiovascular disease -     Lipid panel; Future  GAD (generalized anxiety disorder) -     TSH; Future -     busPIRone (BUSPAR) 30 MG tablet; Take 1.5 tablets (45 mg total) by mouth in the morning and at bedtime. -     Venlafaxine HCl 150 MG TB24; Take 2 tablets (300 mg total) by mouth daily.  Breast cancer screening by mammogram -     MM 3D SCREEN BREAST BILATERAL; Future  Primary hypertension -     CBC with Differential/Platelet; Future -     Comprehensive metabolic panel; Future -     TSH; Future -     amLODipine (NORVASC) 10 MG tablet; Take 1 tablet (10 mg total) by mouth daily.  Postmenopausal estrogen deficiency -     estradiol (CLIMARA - DOSED IN MG/24 HR) 0.1 mg/24hr patch; Place 1 patch (0.1 mg total) onto the skin once a week.   Problem List Items Addressed This Visit      Cardiovascular and Mediastinum   Primary hypertension    BP at goal with amlodipine Refill sent. BP Readings from Last 3 Encounters:  12/04/20 130/90  11/28/20 (!) 152/99  07/23/20 (!) 156/106        Relevant Medications   amLODipine (NORVASC) 10 MG tablet   Other Relevant Orders   CBC with Differential/Platelet   Comprehensive metabolic panel   TSH     Musculoskeletal and Integument   Thickening of skin of breast - Primary    Reports hx of scleroderma: diagnosed by pcp in [place] and dermatology in [place] via blood draw and presentation. No previous skin biopsy. Has scarring under her breast and abdominal wall. No SOB or joint pain or muscle pain or digital ulceration or joint deformity She is requesting for referral to [facility]: Dr. Durenda Age  Advised her about need for previous records from pcp and dermatology prior to rheumatology referral. She is to sign medical release form today. Entered urgent dermatology.       Relevant Orders   CBC with Differential/Platelet   Comprehensive metabolic panel   Ambulatory  referral to Dermatology     Other   GAD (generalized anxiety disorder)    Stable with effexor and buspar. Refill sent      Relevant Medications   busPIRone (BUSPAR) 30 MG tablet   Venlafaxine HCl 150 MG TB24   Other Relevant Orders   TSH   Postmenopausal estrogen deficiency    Current use of estrogen patch in last 17yrs Per patient: menopausal at age 78 FHx of breast cancer: maternal aunts (21s) and 1st cousin (30s). No previous genetic testing Reports last PAP 1.46yrs ago (normal, no hx of abnormal) She is overdue for annual mammogram: order entered today. Advised to  sign medical release form to get records from previous GYN. She understands the risk of cardiovascular event, DVT/PE, and cancer (breast and ovarian)        Relevant Medications   estradiol (CLIMARA - DOSED IN MG/24 HR) 0.1 mg/24hr patch    Other Visit Diagnoses    Encounter for lipid screening for cardiovascular disease       Relevant Orders   Lipid panel   Breast cancer screening by mammogram       Relevant Orders   MM 3D SCREEN BREAST BILATERAL      Follow-up: Return in about 3 months (around 03/05/2021) for HTN and anxiety ( ).  Alysia Penna, NP

## 2020-12-04 NOTE — Assessment & Plan Note (Signed)
Current use of estrogen patch in last 46yrs Per patient: menopausal at age 55 FHx of breast cancer: maternal aunts (73s) and 1st cousin (30s). No previous genetic testing Reports last PAP 1.80yrs ago (normal, no hx of abnormal) She is overdue for annual mammogram: order entered today. Advised to sign medical release form to get records from previous GYN. She understands the risk of cardiovascular event, DVT/PE, and cancer (breast and ovarian)

## 2020-12-04 NOTE — Assessment & Plan Note (Signed)
BP at goal with amlodipine Refill sent. BP Readings from Last 3 Encounters:  12/04/20 130/90  11/28/20 (!) 152/99  07/23/20 (!) 156/106

## 2020-12-04 NOTE — Assessment & Plan Note (Signed)
Stable with effexor and buspar. Refill sent

## 2020-12-28 ENCOUNTER — Other Ambulatory Visit: Payer: Self-pay | Admitting: Nurse Practitioner

## 2020-12-28 DIAGNOSIS — F411 Generalized anxiety disorder: Secondary | ICD-10-CM

## 2021-01-21 DIAGNOSIS — R21 Rash and other nonspecific skin eruption: Secondary | ICD-10-CM | POA: Diagnosis not present

## 2021-01-21 DIAGNOSIS — L905 Scar conditions and fibrosis of skin: Secondary | ICD-10-CM | POA: Diagnosis not present

## 2021-01-21 DIAGNOSIS — D485 Neoplasm of uncertain behavior of skin: Secondary | ICD-10-CM | POA: Diagnosis not present

## 2021-01-25 ENCOUNTER — Other Ambulatory Visit: Payer: Self-pay | Admitting: Nurse Practitioner

## 2021-01-25 DIAGNOSIS — F411 Generalized anxiety disorder: Secondary | ICD-10-CM

## 2021-01-30 ENCOUNTER — Telehealth: Payer: Self-pay | Admitting: Nurse Practitioner

## 2021-01-30 ENCOUNTER — Encounter: Payer: Self-pay | Admitting: Nurse Practitioner

## 2021-01-30 NOTE — Telephone Encounter (Signed)
Patient is calling to get all of her medications sent to an online pharmacy. She states that it's cheaper.  Online Pharmacy: [email or web address] (Pharmacy ID #: [id number])  Patient can be contacted at [phone] if you have any questions.Marland Kitchen

## 2021-01-31 DIAGNOSIS — L940 Localized scleroderma [morphea]: Secondary | ICD-10-CM | POA: Diagnosis not present

## 2021-01-31 NOTE — Telephone Encounter (Signed)
LVM for patient to return call. 

## 2021-02-03 ENCOUNTER — Ambulatory Visit: Payer: BLUE CROSS/BLUE SHIELD | Admitting: Nurse Practitioner

## 2021-02-03 NOTE — Telephone Encounter (Signed)
Patient would like her Buspirone, Venlafaxine & Amlodipine  sent to the online pharmacy. I cannot get it to attach to her chart. Can you?

## 2021-02-05 DIAGNOSIS — D485 Neoplasm of uncertain behavior of skin: Secondary | ICD-10-CM | POA: Diagnosis not present

## 2021-02-05 DIAGNOSIS — L900 Lichen sclerosus et atrophicus: Secondary | ICD-10-CM | POA: Diagnosis not present

## 2021-02-05 DIAGNOSIS — R21 Rash and other nonspecific skin eruption: Secondary | ICD-10-CM | POA: Diagnosis not present

## 2021-02-05 DIAGNOSIS — L989 Disorder of the skin and subcutaneous tissue, unspecified: Secondary | ICD-10-CM | POA: Diagnosis not present

## 2021-02-18 ENCOUNTER — Ambulatory Visit: Payer: BLUE CROSS/BLUE SHIELD

## 2021-02-28 ENCOUNTER — Other Ambulatory Visit: Payer: Self-pay | Admitting: Nurse Practitioner

## 2021-02-28 DIAGNOSIS — I10 Essential (primary) hypertension: Secondary | ICD-10-CM

## 2021-02-28 DIAGNOSIS — F411 Generalized anxiety disorder: Secondary | ICD-10-CM

## 2021-02-28 NOTE — Telephone Encounter (Signed)
Chart supports rx refills Last ov- 12/04/2020 Last refill for amlodipine and Buspirone 01/29/2021

## 2021-03-05 DIAGNOSIS — L89899 Pressure ulcer of other site, unspecified stage: Secondary | ICD-10-CM | POA: Diagnosis not present

## 2021-03-05 DIAGNOSIS — L940 Localized scleroderma [morphea]: Secondary | ICD-10-CM | POA: Diagnosis not present

## 2021-03-25 ENCOUNTER — Ambulatory Visit: Payer: BLUE CROSS/BLUE SHIELD | Admitting: Nurse Practitioner

## 2021-03-30 ENCOUNTER — Other Ambulatory Visit: Payer: Self-pay | Admitting: Nurse Practitioner

## 2021-03-30 DIAGNOSIS — F411 Generalized anxiety disorder: Secondary | ICD-10-CM

## 2021-04-11 ENCOUNTER — Ambulatory Visit: Payer: BLUE CROSS/BLUE SHIELD

## 2021-04-25 DIAGNOSIS — L940 Localized scleroderma [morphea]: Secondary | ICD-10-CM | POA: Diagnosis not present

## 2021-04-29 ENCOUNTER — Ambulatory Visit
Admission: RE | Admit: 2021-04-29 | Discharge: 2021-04-29 | Disposition: A | Discharge status: Home / Self Care | Payer: BLUE CROSS/BLUE SHIELD | Source: Ambulatory Visit | Attending: Nurse Practitioner | Admitting: Nurse Practitioner

## 2021-04-29 ENCOUNTER — Other Ambulatory Visit: Payer: Self-pay

## 2021-04-29 DIAGNOSIS — Z1231 Encounter for screening mammogram for malignant neoplasm of breast: Secondary | ICD-10-CM

## 2021-05-20 ENCOUNTER — Other Ambulatory Visit: Payer: Self-pay | Admitting: Nurse Practitioner

## 2021-05-20 DIAGNOSIS — R928 Other abnormal and inconclusive findings on diagnostic imaging of breast: Secondary | ICD-10-CM

## 2021-05-27 ENCOUNTER — Other Ambulatory Visit: Payer: Self-pay | Admitting: Nurse Practitioner

## 2021-05-27 DIAGNOSIS — I10 Essential (primary) hypertension: Secondary | ICD-10-CM

## 2021-05-29 ENCOUNTER — Other Ambulatory Visit: Payer: Self-pay | Admitting: Nurse Practitioner

## 2021-05-29 DIAGNOSIS — F411 Generalized anxiety disorder: Secondary | ICD-10-CM

## 2021-06-05 ENCOUNTER — Ambulatory Visit
Admission: RE | Admit: 2021-06-05 | Discharge: 2021-06-05 | Disposition: A | Discharge status: Home / Self Care | Payer: BLUE CROSS/BLUE SHIELD | Source: Ambulatory Visit | Attending: Nurse Practitioner | Admitting: Nurse Practitioner

## 2021-06-05 ENCOUNTER — Other Ambulatory Visit: Payer: Self-pay

## 2021-06-05 DIAGNOSIS — N6489 Other specified disorders of breast: Secondary | ICD-10-CM | POA: Diagnosis not present

## 2021-06-05 DIAGNOSIS — R928 Other abnormal and inconclusive findings on diagnostic imaging of breast: Secondary | ICD-10-CM

## 2021-06-25 ENCOUNTER — Encounter: Payer: Self-pay | Admitting: Nurse Practitioner

## 2021-06-25 ENCOUNTER — Other Ambulatory Visit: Payer: Self-pay | Admitting: Nurse Practitioner

## 2021-06-25 ENCOUNTER — Other Ambulatory Visit: Payer: Self-pay

## 2021-06-25 ENCOUNTER — Ambulatory Visit (INDEPENDENT_AMBULATORY_CARE_PROVIDER_SITE_OTHER): Payer: BLUE CROSS/BLUE SHIELD | Admitting: Nurse Practitioner

## 2021-06-25 VITALS — BP 134/90 | HR 98 | Temp 97.4°F | Ht 63.75 in | Wt 223.2 lb

## 2021-06-25 DIAGNOSIS — F411 Generalized anxiety disorder: Secondary | ICD-10-CM | POA: Diagnosis not present

## 2021-06-25 DIAGNOSIS — E7800 Pure hypercholesterolemia, unspecified: Secondary | ICD-10-CM

## 2021-06-25 DIAGNOSIS — Z1211 Encounter for screening for malignant neoplasm of colon: Secondary | ICD-10-CM | POA: Diagnosis not present

## 2021-06-25 DIAGNOSIS — I10 Essential (primary) hypertension: Secondary | ICD-10-CM

## 2021-06-25 DIAGNOSIS — Z13220 Encounter for screening for lipoid disorders: Secondary | ICD-10-CM

## 2021-06-25 LAB — CBC WITH DIFFERENTIAL/PLATELET
Basophils Absolute: 0 10*3/uL (ref 0.0–0.1)
Basophils Relative: 0.6 % (ref 0.0–3.0)
Eosinophils Absolute: 0 10*3/uL (ref 0.0–0.7)
Eosinophils Relative: 1 % (ref 0.0–5.0)
HCT: 37.2 % (ref 36.0–46.0)
Hemoglobin: 12.5 g/dL (ref 12.0–15.0)
Lymphocytes Relative: 24.9 % (ref 12.0–46.0)
Lymphs Abs: 1.1 10*3/uL (ref 0.7–4.0)
MCHC: 33.5 g/dL (ref 30.0–36.0)
MCV: 87.6 fl (ref 78.0–100.0)
Monocytes Absolute: 0.3 10*3/uL (ref 0.1–1.0)
Monocytes Relative: 6.4 % (ref 3.0–12.0)
Neutro Abs: 3.1 10*3/uL (ref 1.4–7.7)
Neutrophils Relative %: 67.1 % (ref 43.0–77.0)
Platelets: 216 10*3/uL (ref 150.0–400.0)
RBC: 4.24 Mil/uL (ref 3.87–5.11)
RDW: 13.6 % (ref 11.5–15.5)
WBC: 4.6 10*3/uL (ref 4.0–10.5)

## 2021-06-25 MED ORDER — BUSPIRONE HCL 30 MG PO TABS: 45.0000 mg | ORAL_TABLET | Freq: Two times a day (BID) | ORAL | 0 refills | Status: DC

## 2021-06-25 MED ORDER — VENLAFAXINE HCL ER 150 MG PO TB24: 2.0000 | ORAL_TABLET | Freq: Every day | ORAL | 3 refills | Status: DC

## 2021-06-25 MED ORDER — AMLODIPINE BESYLATE 10 MG PO TABS
10.0000 mg | ORAL_TABLET | Freq: Every day | ORAL | 0 refills | Status: DC
  Filled 2022-03-27: qty 30, 30d supply, fill #0

## 2021-06-25 NOTE — Assessment & Plan Note (Signed)
Stable BP with amlodipine BP Readings from Last 3 Encounters:  06/25/21 134/90  12/04/20 130/90  11/28/20 (!) 152/99   Refill sent

## 2021-06-25 NOTE — Progress Notes (Signed)
Subjective:  Patient ID: Sherry Hansen, female    DOB: [birth year]  Age: 55 y.o. MRN: [id number]  CC: Follow-up (Pt states she is in need of refills (Amlodipine, buspirone, and venlafaxine). /)  HPI  Primary hypertension Stable BP with amlodipine BP Readings from Last 3 Encounters:  06/25/21 134/90  12/04/20 130/90  11/28/20 (!) 152/99   Refill sent  GAD (generalized anxiety disorder) Stable mood with effexor and buspar Refill sent  Reviewed past Medical, Social and Family history today.  Outpatient Medications Prior to Visit  Medication Sig Dispense Refill   chlorhexidine (HIBICLENS) 4 % external liquid Apply topically daily as needed. 120 mL 0   doxycycline (VIBRA-TABS) 100 MG tablet Take 1 tablet (100 mg total) by mouth 2 (two) times daily. 20 tablet 0   estradiol (CLIMARA - DOSED IN MG/24 HR) 0.1 mg/24hr patch Place 1 patch (0.1 mg total) onto the skin once a week. 4 patch 0   loratadine (CLARITIN) 10 MG tablet Take 10 mg by mouth daily.     nystatin cream (MYCOSTATIN) Apply to affected area 2 times daily 30 g 0   triamcinolone cream (KENALOG) 0.1 % Apply 1 application topically 2 (two) times daily. 100 g 0   amLODipine (NORVASC) 10 MG tablet TAKE 1 TABLET BY MOUTH EVERY DAY 30 tablet 2   busPIRone (BUSPAR) 30 MG tablet TAKE 1.5 TABLETS (45 MG TOTAL) BY MOUTH IN THE MORNING AND AT BEDTIME. 270 tablet 0   Venlafaxine HCl 150 MG TB24 Take 2 tablets (300 mg total) by mouth daily. No additional refills without office visit 180 tablet 0   No facility-administered medications prior to visit.   ROS See HPI  Objective:  BP 134/90 (BP Location: Left Arm, Patient Position: Sitting, Cuff Size: Large)   Pulse 98   Temp (!) 97.4 F (36.3 C) (Temporal)   Ht 5' 3.75" (1.619 m)   Wt 223 lb 3.2 oz (101.2 kg)   SpO2 98%   BMI 38.61 kg/m   Physical Exam Constitutional:      General: She is not in acute distress. Eyes:     General: No scleral  icterus. Cardiovascular:     Rate and Rhythm: Normal rate.     Pulses: Normal pulses.  Pulmonary:     Effort: Pulmonary effort is normal. No respiratory distress.  Neurological:     Mental Status: She is alert and oriented to person, place, and time.  Psychiatric:        Mood and Affect: Mood normal.        Behavior: Behavior normal.        Thought Content: Thought content normal.   Assessment & Plan:  This visit occurred during the SARS-CoV-2 public health emergency.  Safety protocols were in place, including screening questions prior to the visit, additional usage of staff PPE, and extensive cleaning of exam room while observing appropriate contact time as indicated for disinfecting solutions.   Vanya was seen today for follow-up.  Diagnoses and all orders for this visit:  Primary hypertension -     amLODipine (NORVASC) 10 MG tablet; Take 1 tablet (10 mg total) by mouth daily. -     TSH -     Comprehensive metabolic panel -     CBC with Differential/Platelet  GAD (generalized anxiety disorder) -     busPIRone (BUSPAR) 30 MG tablet; Take 1.5 tablets (45 mg total) by mouth in the morning and at bedtime. -     Venlafaxine HCl  150 MG TB24; Take 2 tablets (300 mg total) by mouth daily. -     TSH -     Comprehensive metabolic panel -     CBC with Differential/Platelet  Colon cancer screening -     Cologuard; Future  Encounter for lipid screening for cardiovascular disease -     Lipid panel   Problem List Items Addressed This Visit       Cardiovascular and Mediastinum   Primary hypertension - Primary    Stable BP with amlodipine BP Readings from Last 3 Encounters:  06/25/21 134/90  12/04/20 130/90  11/28/20 (!) 152/99   Refill sent      Relevant Medications   amLODipine (NORVASC) 10 MG tablet     Other   GAD (generalized anxiety disorder)    Stable mood with effexor and buspar Refill sent      Relevant Medications   busPIRone (BUSPAR) 30 MG tablet    Venlafaxine HCl 150 MG TB24   Other Visit Diagnoses     Colon cancer screening       Relevant Orders   Cologuard   Encounter for lipid screening for cardiovascular disease           Follow-up: Return in about 1 year (around 06/25/2022) for CPE (fasting).  Alysia Penna, NP

## 2021-06-25 NOTE — Patient Instructions (Addendum)
Go to lab for blood draw ?Maintain current medications ?

## 2021-06-25 NOTE — Assessment & Plan Note (Signed)
Stable mood with effexor and buspar Refill sent

## 2021-06-26 LAB — COMPREHENSIVE METABOLIC PANEL
ALT: 24 U/L (ref 0–35)
AST: 29 U/L (ref 0–37)
Albumin: 4.3 g/dL (ref 3.5–5.2)
Alkaline Phosphatase: 59 U/L (ref 39–117)
BUN: 16 mg/dL (ref 6–23)
CO2: 24 mEq/L (ref 19–32)
Calcium: 9.2 mg/dL (ref 8.4–10.5)
Chloride: 103 mEq/L (ref 96–112)
Creatinine, Ser: 0.77 mg/dL (ref 0.40–1.20)
GFR: 87.03 mL/min (ref >60.00)
Glucose, Bld: 101 mg/dL — ABNORMAL HIGH (ref 70–99)
Potassium: 4.5 mEq/L (ref 3.5–5.1)
Sodium: 137 mEq/L (ref 135–145)
Total Bilirubin: 0.4 mg/dL (ref 0.2–1.2)
Total Protein: 7 g/dL (ref 6.0–8.3)

## 2021-06-26 LAB — LIPID PANEL
Cholesterol: 213 mg/dL — ABNORMAL HIGH (ref 0–200)
HDL: 60.1 mg/dL (ref >39.00)
LDL Cholesterol: 123 mg/dL — ABNORMAL HIGH (ref 0–99)
NonHDL: 153.11
Total CHOL/HDL Ratio: 4
Triglycerides: 152 mg/dL — ABNORMAL HIGH (ref 0.0–149.0)
VLDL: 30.4 mg/dL (ref 0.0–40.0)

## 2021-06-26 LAB — TSH: TSH: 2.47 u[IU]/mL (ref 0.35–5.50)

## 2021-06-30 DIAGNOSIS — E7800 Pure hypercholesterolemia, unspecified: Secondary | ICD-10-CM | POA: Insufficient documentation

## 2021-06-30 NOTE — Addendum Note (Signed)
Addended by: Alysia Penna L on: 06/30/2021 01:18 PM   Modules accepted: Orders

## 2021-07-17 ENCOUNTER — Other Ambulatory Visit: Payer: Self-pay

## 2021-07-17 DIAGNOSIS — F411 Generalized anxiety disorder: Secondary | ICD-10-CM

## 2021-07-17 MED ORDER — VENLAFAXINE HCL ER 150 MG PO TB24: 2.0000 | ORAL_TABLET | Freq: Every day | ORAL | 2 refills | Status: DC

## 2021-07-17 NOTE — Telephone Encounter (Signed)
Spoke with patient, she has been paying for medication out of pocket and is requesting a change in pharmacy to a online pharmacy so that she can get medication for a reduced price. I will contact pharmacy and try to make the change to patient chart. Pt notified and verbalized understanding.

## 2021-07-17 NOTE — Telephone Encounter (Signed)
Spoke with patient, she has been paying for medication out of pocket and is requesting a change in pharmacy to a online pharmacy so that she can get medication for a reduced price. I will contact pharmacy and try to make the change to patient chart. Pt notified and verbalized understanding  I have updated patient pharmacy and sent her refills to this pharmacy. Pt has been notified.

## 2021-10-21 DIAGNOSIS — L940 Localized scleroderma [morphea]: Secondary | ICD-10-CM | POA: Diagnosis not present

## 2021-10-22 IMAGING — US US BREAST*R* LIMITED INC AXILLA
1 series · 7 of 7 positions shown · non-contrast
Comparison: None.

ACR Breast Density Category a: The breast tissue is almost entirely
fatty.

CLINICAL DATA: Screening recall from baseline mammography for right
breast asymmetry.

EXAM:
DIGITAL DIAGNOSTIC UNILATERAL RIGHT MAMMOGRAM WITH TOMOSYNTHESIS AND
CAD; ULTRASOUND RIGHT BREAST LIMITED
TECHNIQUE: Right digital diagnostic mammography and breast tomosynthesis was
performed. The images were evaluated with computer-aided detection.;
Targeted ultrasound examination of the right breast was performed

[Series 1: us breast*right* limited inc axilla · 0.07mm/px · 7 of 7 slices shown]
[im 1/7]
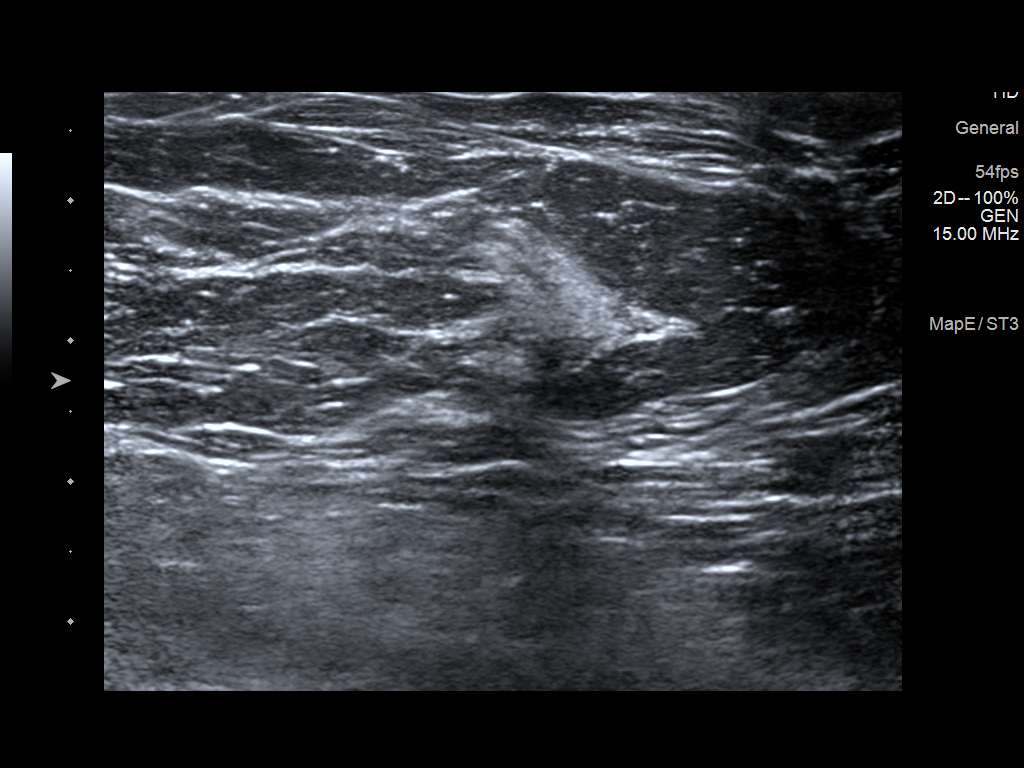
[im 2/7]
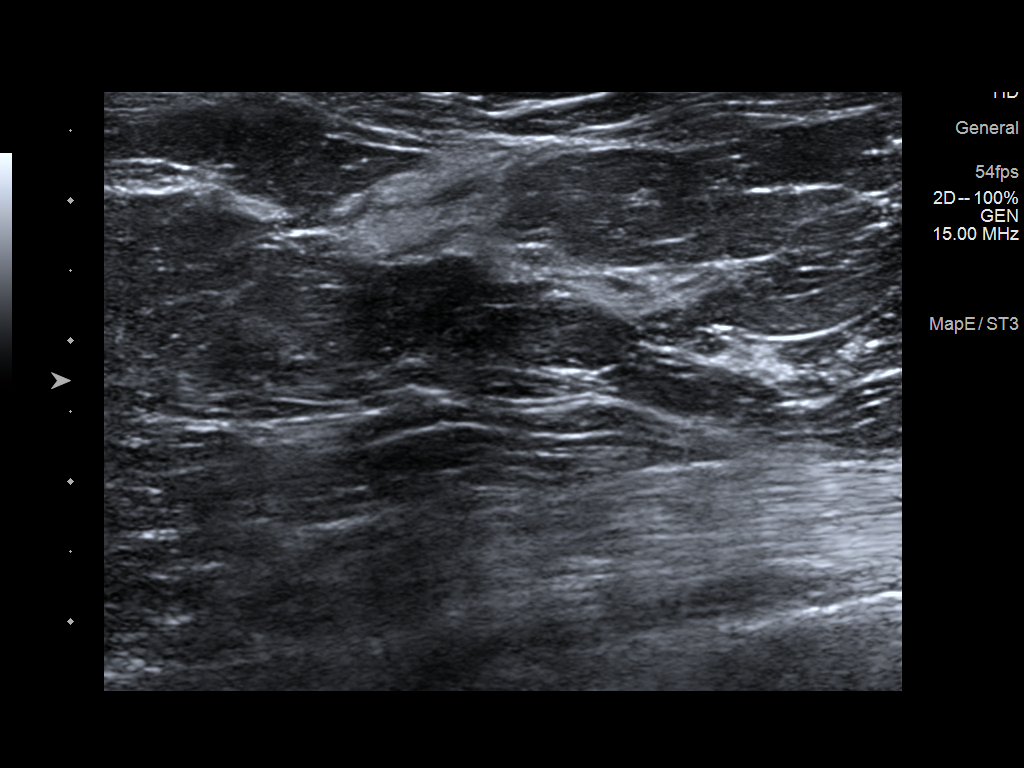
[im 3/7]
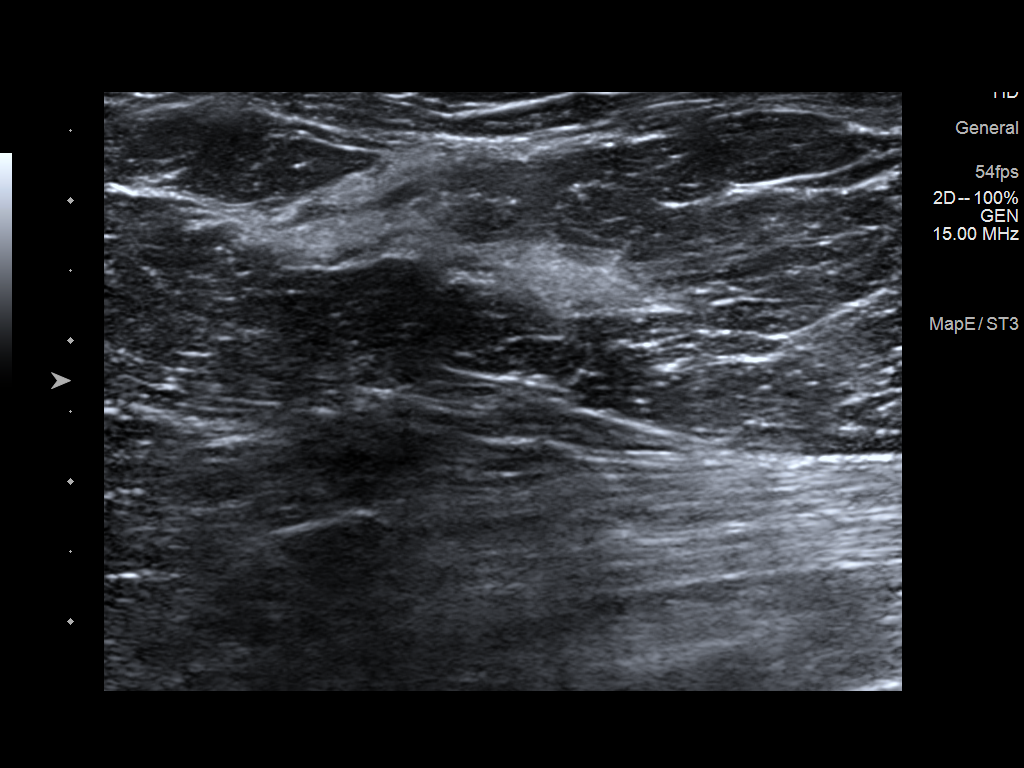
[im 4/7]
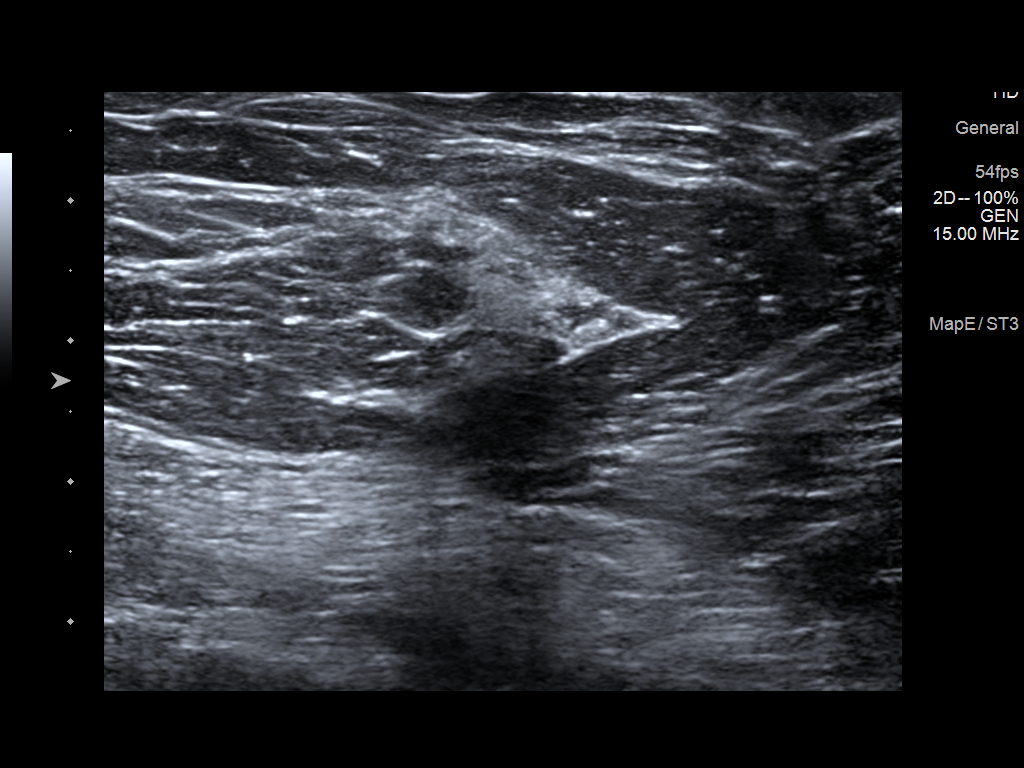
[im 5/7]
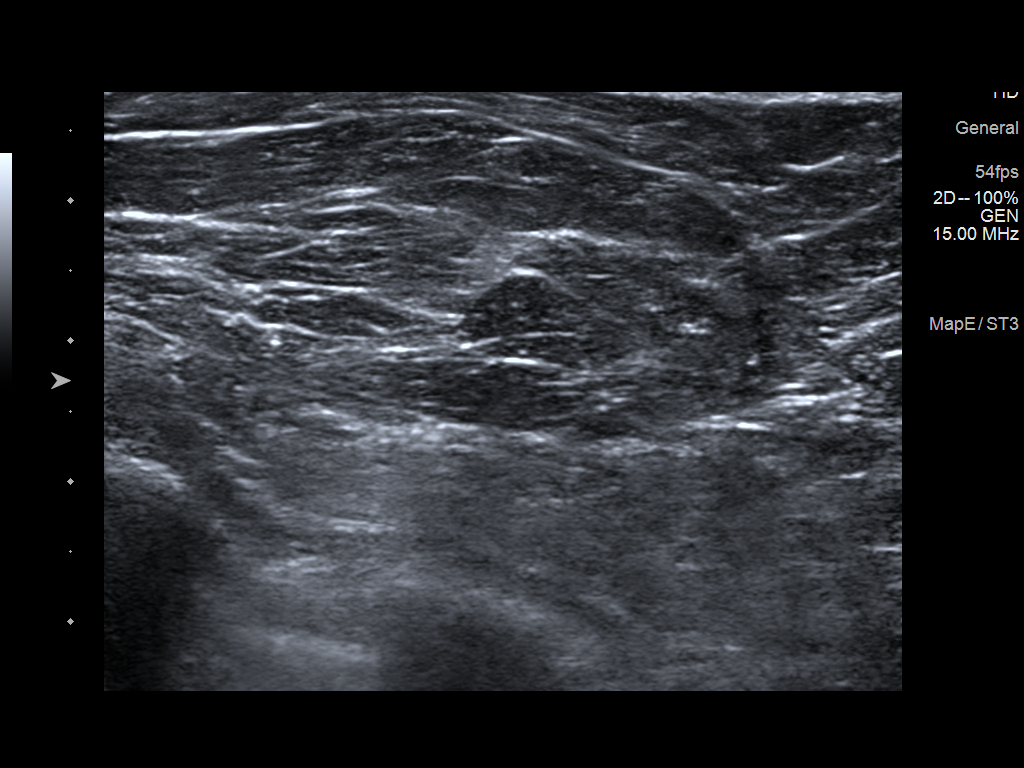
[im 6/7]
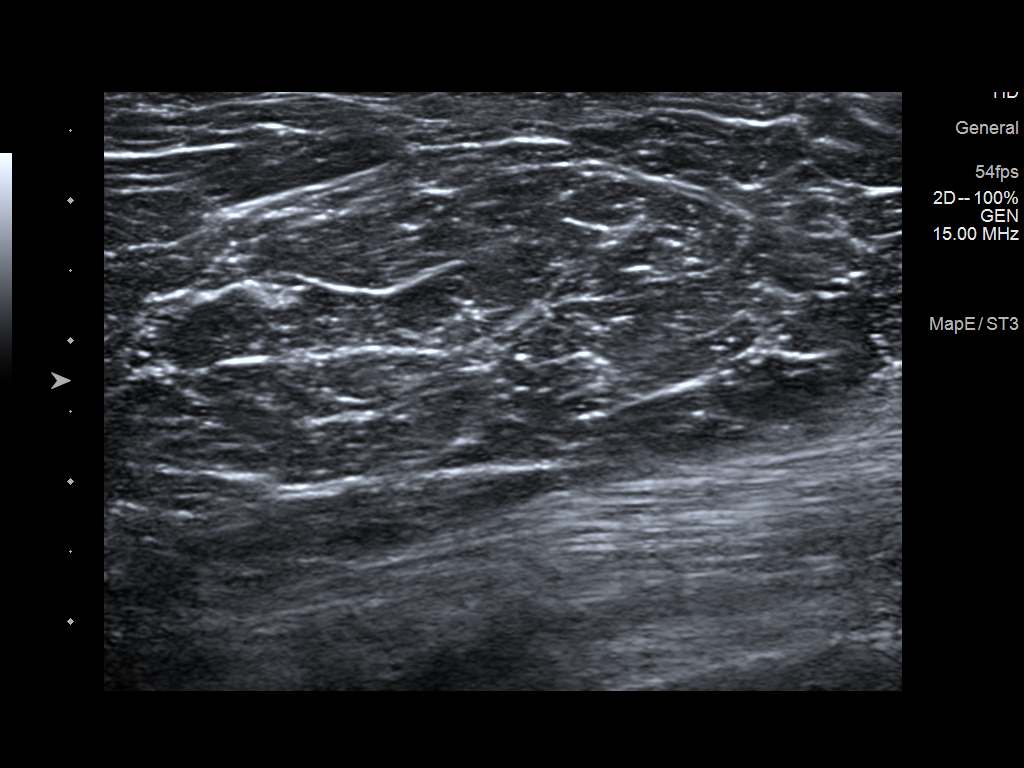
[im 7/7]
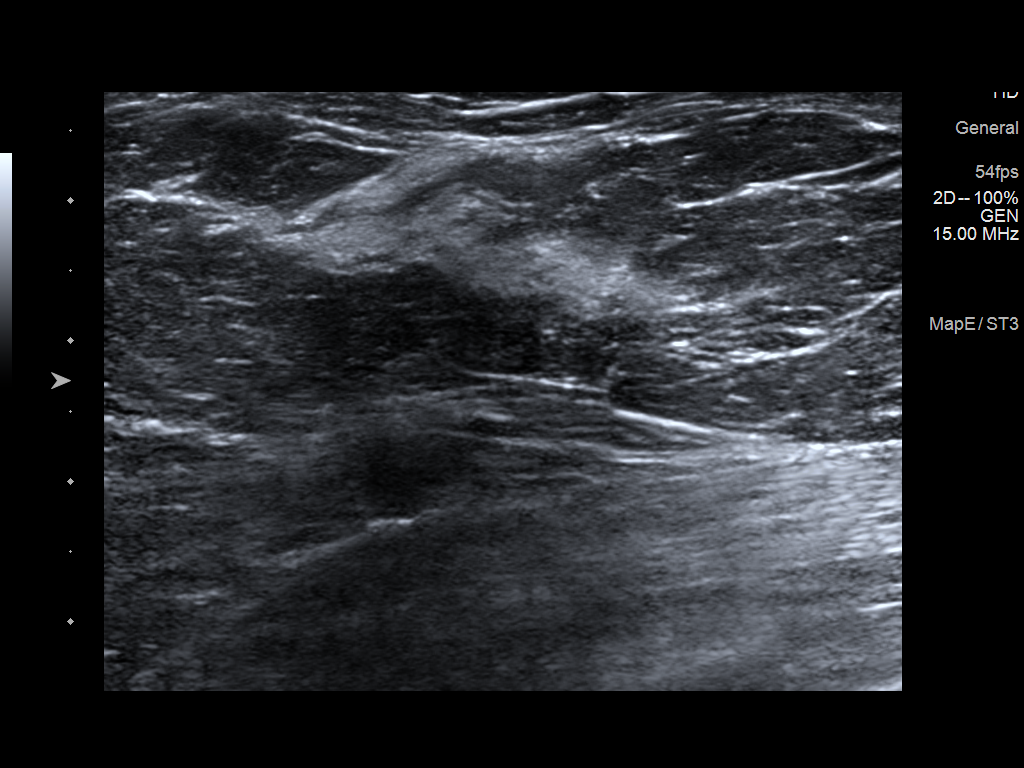

[7 of 7 positions shown; findings below may reference images not displayed]

FINDINGS: Additional tomograms were performed of the right breast. The
initially questioned possible right breast asymmetry disperses on
the additional imaging switch findings suggestive of an area of
focal fibroglandular tissue.

Targeted ultrasound of the right breast was performed. No suspicious
masses or abnormality seen, only a band of normal/dense
fibroglandular tissue identified at 12 o'clock 2 cm from nipple.
This corresponds well with the asymmetry seen in the right breast at
mammography.
IMPRESSION: No findings of malignancy in the right breast.

RECOMMENDATION:
Screening mammogram in one year.(Code:JA-2-IAS)

I have discussed the findings and recommendations with the patient.
If applicable, a reminder letter will be sent to the patient
regarding the next appointment.

BI-RADS CATEGORY  1: Negative.

## 2021-10-22 IMAGING — MG MM DIGITAL DIAGNOSTIC UNILAT*R* W/ TOMO W/ CAD
4 series · 4 of 12 positions shown · non-contrast
Comparison: None.

ACR Breast Density Category a: The breast tissue is almost entirely
fatty.

CLINICAL DATA: Screening recall from baseline mammography for right
breast asymmetry.

EXAM:
DIGITAL DIAGNOSTIC UNILATERAL RIGHT MAMMOGRAM WITH TOMOSYNTHESIS AND
CAD; ULTRASOUND RIGHT BREAST LIMITED
TECHNIQUE: Right digital diagnostic mammography and breast tomosynthesis was
performed. The images were evaluated with computer-aided detection.;
Targeted ultrasound examination of the right breast was performed

[R MLO synth-2D]
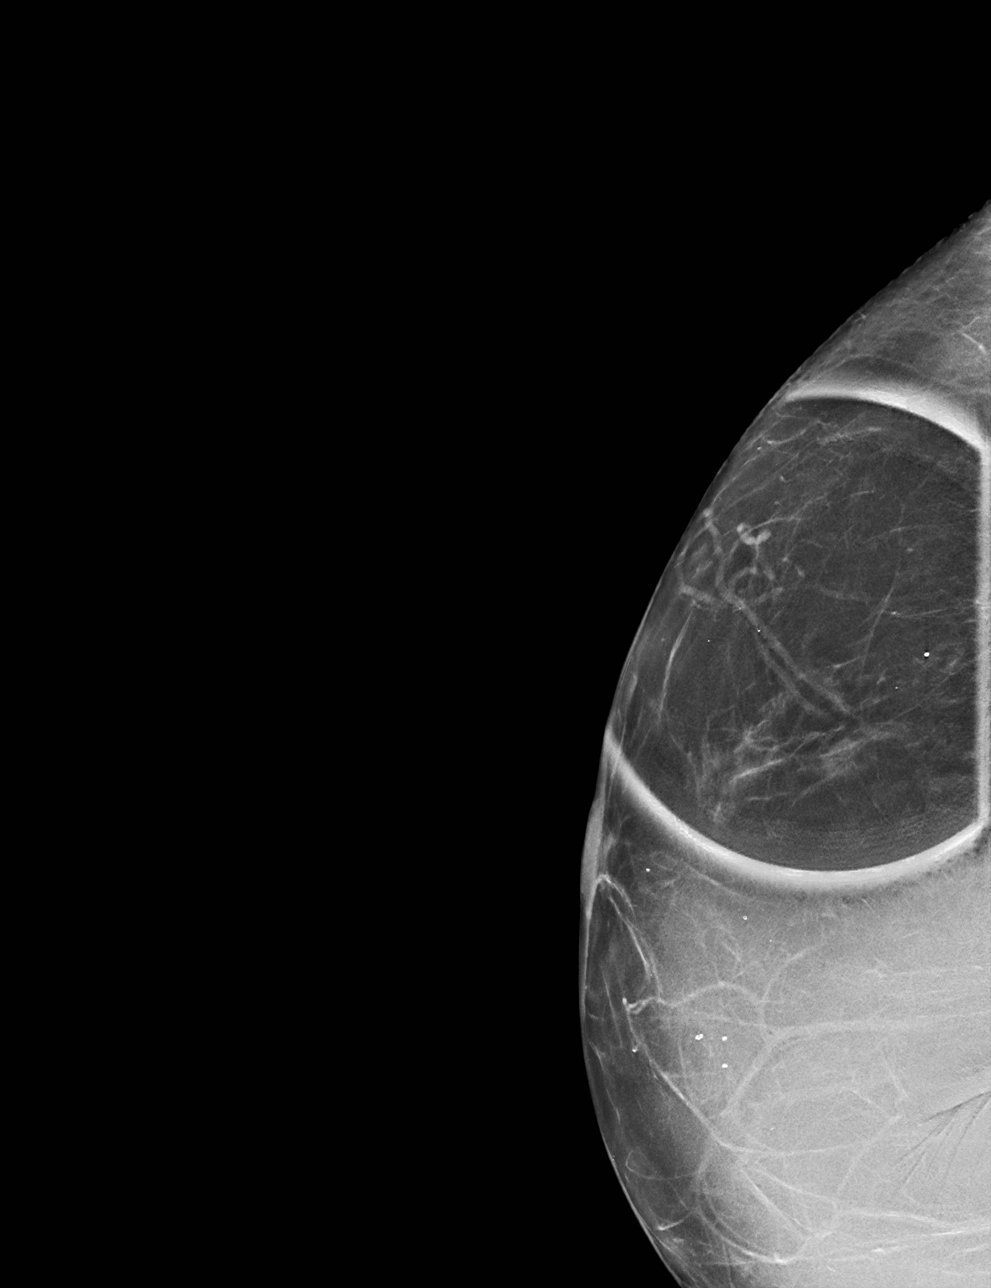

[R CC synth-2D]
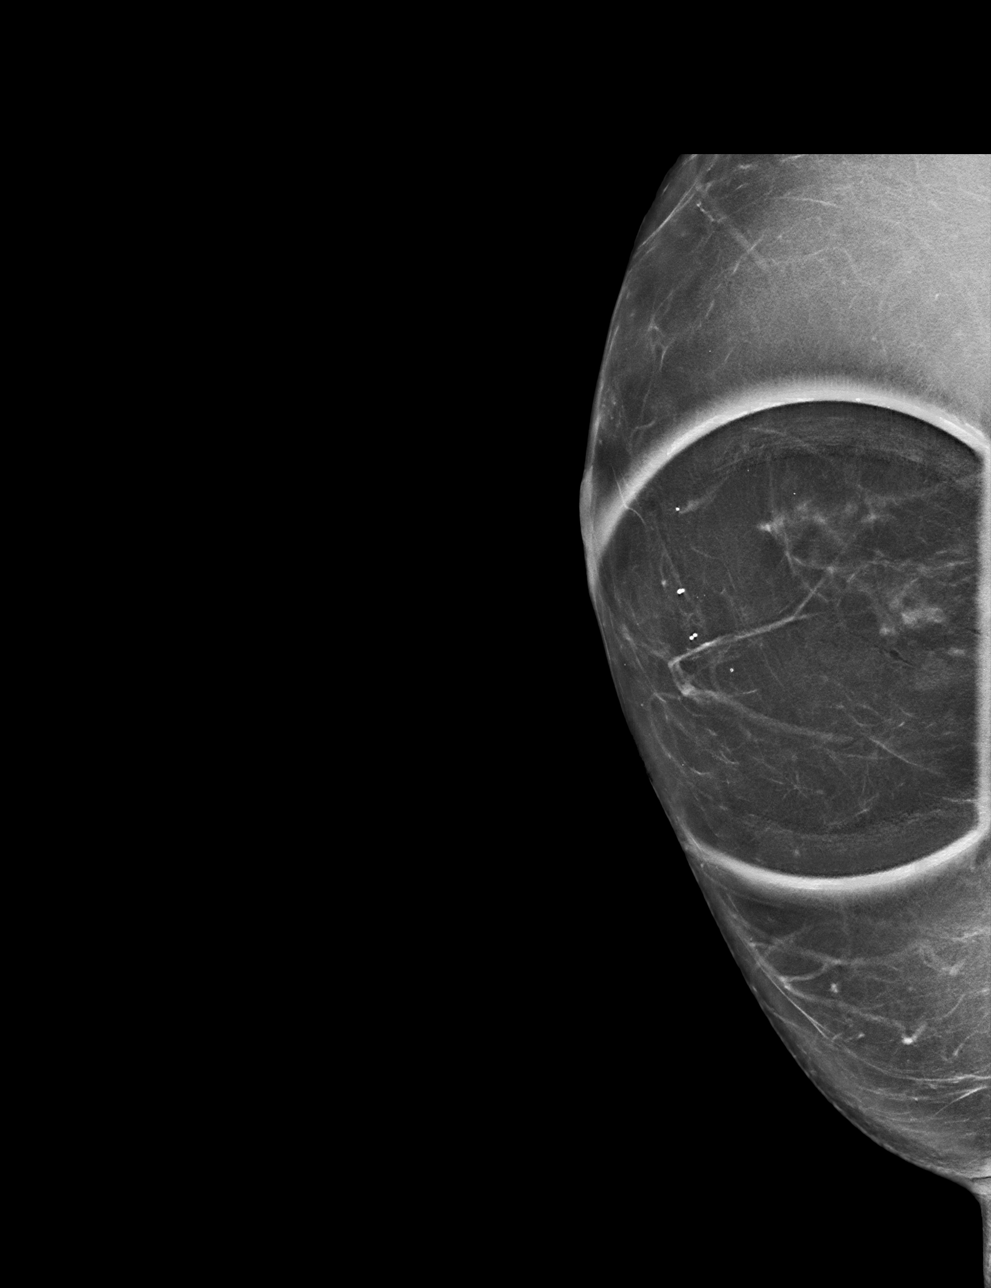

[R CC tomo · tomo slice 31/62.0]
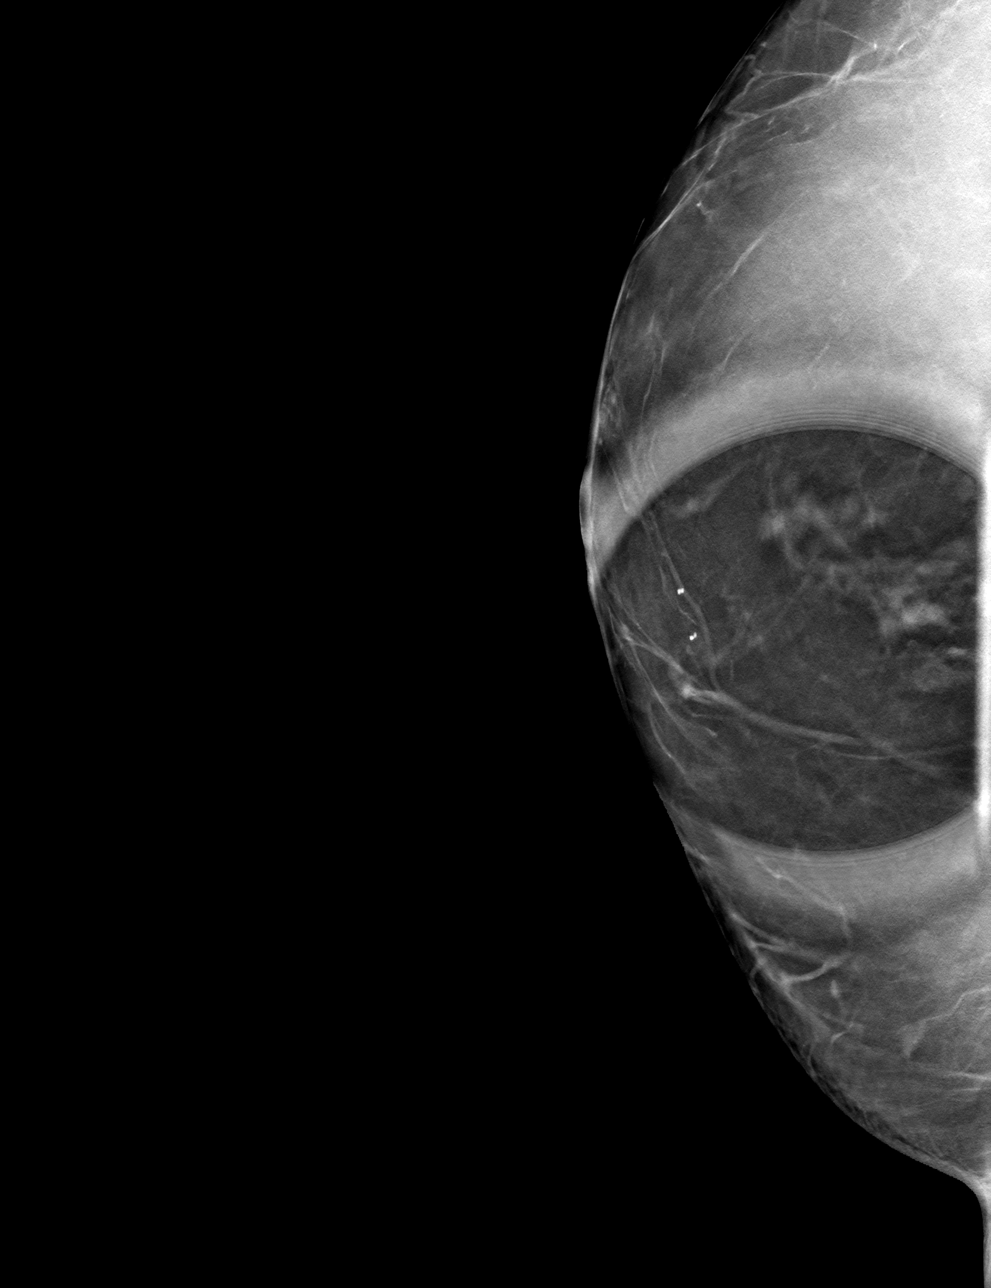

[R MLO tomo · tomo slice 29/58.0]
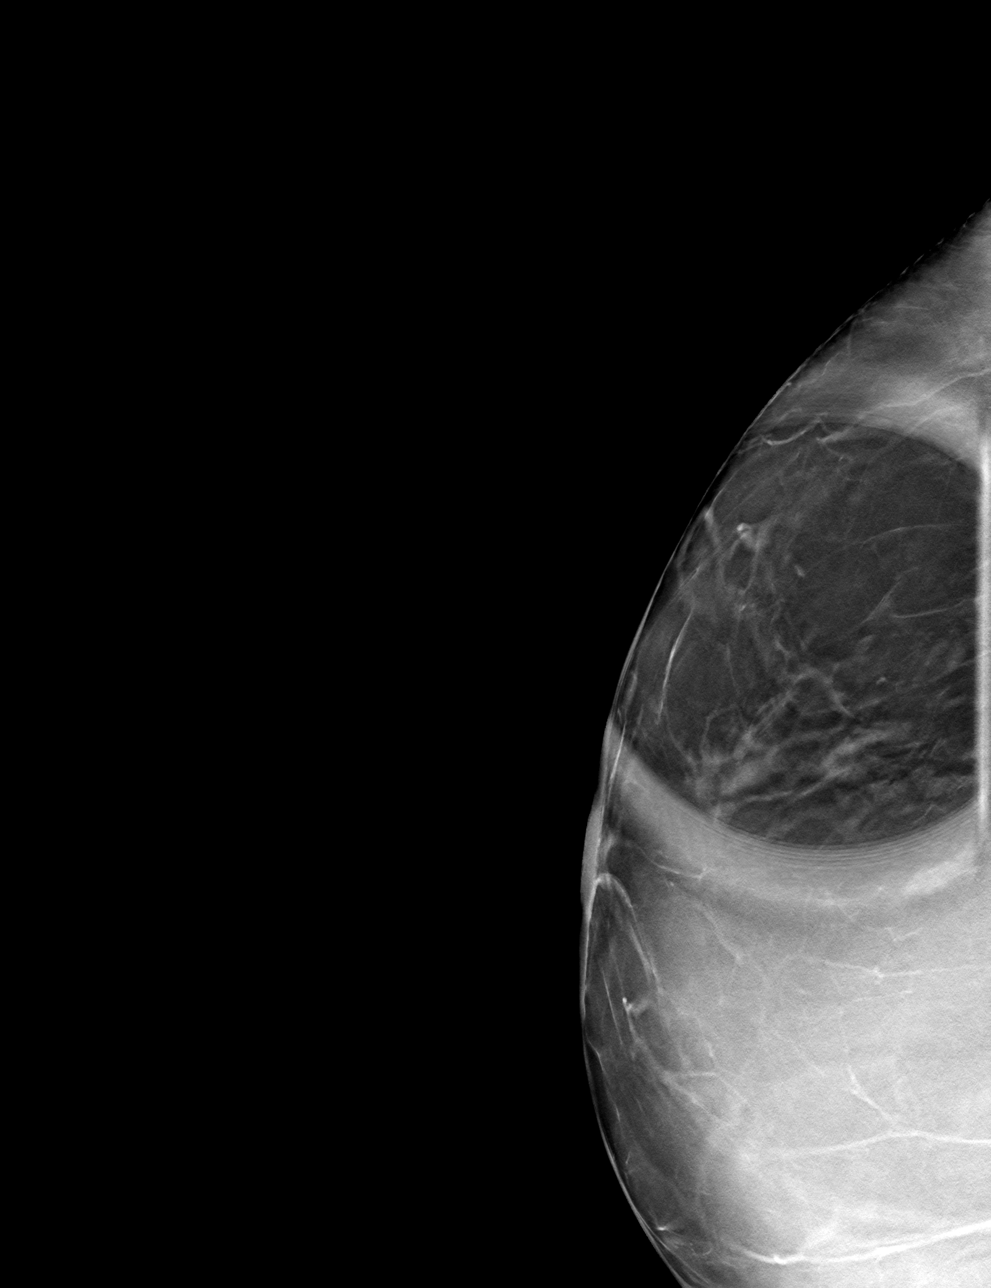

[4 of 12 positions shown; findings below may reference images not displayed]

FINDINGS: Additional tomograms were performed of the right breast. The
initially questioned possible right breast asymmetry disperses on
the additional imaging switch findings suggestive of an area of
focal fibroglandular tissue.

Targeted ultrasound of the right breast was performed. No suspicious
masses or abnormality seen, only a band of normal/dense
fibroglandular tissue identified at 12 o'clock 2 cm from nipple.
This corresponds well with the asymmetry seen in the right breast at
mammography.
IMPRESSION: No findings of malignancy in the right breast.

RECOMMENDATION:
Screening mammogram in one year.(Code:JA-2-IAS)

I have discussed the findings and recommendations with the patient.
If applicable, a reminder letter will be sent to the patient
regarding the next appointment.

BI-RADS CATEGORY  1: Negative.

## 2021-10-29 ENCOUNTER — Other Ambulatory Visit: Payer: Self-pay | Admitting: Nurse Practitioner

## 2021-10-29 DIAGNOSIS — F411 Generalized anxiety disorder: Secondary | ICD-10-CM

## 2021-10-30 MED ORDER — BUSPIRONE HCL 30 MG PO TABS: 45.0000 mg | ORAL_TABLET | Freq: Two times a day (BID) | ORAL | 0 refills | Status: DC

## 2021-10-30 NOTE — Telephone Encounter (Signed)
Chart supports Rx ?Last seen 06/2021 ?Next appointment due around 06/2022 ?

## 2022-01-30 DIAGNOSIS — F32A Depression, unspecified: Secondary | ICD-10-CM | POA: Diagnosis not present

## 2022-02-10 DIAGNOSIS — Z79899 Other long term (current) drug therapy: Secondary | ICD-10-CM | POA: Diagnosis not present

## 2022-02-10 DIAGNOSIS — L940 Localized scleroderma [morphea]: Secondary | ICD-10-CM | POA: Diagnosis not present

## 2022-02-20 DIAGNOSIS — F32A Depression, unspecified: Secondary | ICD-10-CM | POA: Diagnosis not present

## 2022-02-24 ENCOUNTER — Other Ambulatory Visit (HOSPITAL_COMMUNITY): Payer: Self-pay

## 2022-02-24 ENCOUNTER — Other Ambulatory Visit: Payer: Self-pay | Admitting: Nurse Practitioner

## 2022-02-24 DIAGNOSIS — F411 Generalized anxiety disorder: Secondary | ICD-10-CM

## 2022-02-24 MED ORDER — BUSPIRONE HCL 30 MG PO TABS
45.0000 mg | ORAL_TABLET | Freq: Two times a day (BID) | ORAL | 0 refills | Status: DC
  Filled 2022-02-24: qty 91, 30d supply, fill #0
  Filled 2022-03-10: qty 90, 30d supply, fill #0

## 2022-02-24 NOTE — Telephone Encounter (Signed)
Chart supports Rx Last OV: 06/2021 Next OV: Not scheduled 30 day supply given, Pt needs an appt for further refills.

## 2022-02-26 ENCOUNTER — Other Ambulatory Visit (HOSPITAL_COMMUNITY): Payer: Self-pay

## 2022-03-03 ENCOUNTER — Other Ambulatory Visit: Payer: Self-pay | Admitting: Nurse Practitioner

## 2022-03-03 DIAGNOSIS — F411 Generalized anxiety disorder: Secondary | ICD-10-CM

## 2022-03-06 ENCOUNTER — Other Ambulatory Visit (HOSPITAL_COMMUNITY): Payer: Self-pay

## 2022-03-10 ENCOUNTER — Other Ambulatory Visit (HOSPITAL_COMMUNITY): Payer: Self-pay

## 2022-03-11 ENCOUNTER — Other Ambulatory Visit (HOSPITAL_COMMUNITY): Payer: Self-pay

## 2022-03-11 MED ORDER — VENLAFAXINE HCL ER 150 MG PO CP24
300.0000 mg | ORAL_CAPSULE | Freq: Every day | ORAL | 0 refills | Status: DC
  Filled 2022-03-27: qty 60, 30d supply, fill #0

## 2022-03-20 DIAGNOSIS — F32A Depression, unspecified: Secondary | ICD-10-CM | POA: Diagnosis not present

## 2022-03-27 ENCOUNTER — Other Ambulatory Visit (HOSPITAL_COMMUNITY): Payer: Self-pay

## 2022-03-27 ENCOUNTER — Other Ambulatory Visit: Payer: Self-pay | Admitting: Nurse Practitioner

## 2022-03-27 ENCOUNTER — Ambulatory Visit (HOSPITAL_COMMUNITY)
Admission: EM | Admit: 2022-03-27 | Discharge: 2022-03-27 | Disposition: A | Discharge status: Home / Self Care | Payer: BLUE CROSS/BLUE SHIELD

## 2022-03-27 ENCOUNTER — Telehealth: Payer: Self-pay | Admitting: Nurse Practitioner

## 2022-03-27 DIAGNOSIS — F411 Generalized anxiety disorder: Secondary | ICD-10-CM | POA: Diagnosis not present

## 2022-03-27 DIAGNOSIS — F32A Depression, unspecified: Secondary | ICD-10-CM | POA: Diagnosis not present

## 2022-03-27 DIAGNOSIS — Z780 Asymptomatic menopausal state: Secondary | ICD-10-CM

## 2022-03-27 MED ORDER — ESTRADIOL 0.1 MG/24HR TD PTWK
0.1000 mg | MEDICATED_PATCH | TRANSDERMAL | 0 refills | Status: DC
  Filled 2022-03-27: qty 4, 28d supply, fill #0

## 2022-03-27 NOTE — Telephone Encounter (Signed)
Pt called and said she is having a panic attack. I called triage nurse and talked to renee. I also informed the pt that her provider is gone for the day and we are full here today

## 2022-03-27 NOTE — Telephone Encounter (Signed)
Noted  

## 2022-03-27 NOTE — Progress Notes (Signed)
   03/27/22 1636  [facility] Triage Screening (Walk-ins at [facility] only)  How Did You Hear About [place]? Self  What Is the Reason for Your Visit/Call Today? 56 year old Dannetta Lekas reported she has been having panic attacks the past couple days. Report her therapist referred her here today. Report racing thoughts, uncontrollable crying, cold sweets, and racing heart (denied currently). Report symptoms are worse in the morning but as the day goes on getting better. Report she moved to [place] from [place], report she knows no one here. 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  Denied substance abuse. Report having 2-3 panic attacks daily since Wednesday. Report last panic attacks today 03/27/2022. Denied SI/HI and AVH.  How Long Has This Been Causing You Problems? <Week  Have You Recently Had Any Thoughts About Hurting Yourself? No  Are You Planning to Commit Suicide/Harm Yourself At This time? No  Have you Recently Had Thoughts About Hurting Someone Karolee Ohs? No  Are You Planning To Harm Someone At This Time? No  Are you currently experiencing any auditory, visual or other hallucinations? No  Have You Used Any Alcohol or Drugs in the Past 24 Hours? No  Do you have any current medical co-morbidities that require immediate attention? No  Clinician description of patient physical appearance/behavior: present very tearful, yet cooperative and talkative.  What Do You Feel Would Help You the Most Today? Medication(s)  If access to [facility] was not available, would you have sought care in the Emergency Department? No  Determination of Need Routine (7 days)  Options For Referral Medication Management

## 2022-03-27 NOTE — ED Provider Notes (Addendum)
Behavioral Health Urgent Care Medical Screening Exam  Patient Name: Sherry Hansen MRN: [id number] Date of Evaluation: 03/27/22 Chief Complaint:   Diagnosis:  Final diagnoses:  GAD (generalized anxiety disorder)    History of Present illness: Sherry Hansen is a 56 y.o. female. Patient presents voluntarily to [facility] behavioral health for walk-in assessment.   Patient is assessed, face-to-face, by nurse practitioner, seated in assessment area, no acute distress.  She  is alert and oriented, pleasant and cooperative during assessment.   Sherry Hansen is seeking outpatient psychiatry medication management resources.  She relocated from [place] to [place] 2 years ago.  She meets with her outpatient counselor, Beth weekly.  Outpatient counselor suggested she seek resources at [facility] behavioral health today.  Sherry Hansen states "how do I find a good psychiatrist?"  Sherry Hansen has been diagnosed with depression and anxiety for over 20 years.  She has been stable on medications including venlafaxine 300 mg daily and buspirone 45 mg twice daily for many years.  She denies history of inpatient psychiatric hospitalization.  No family mental health history reported.  Recent stressors include patient's father who had a fall earlier this week as well as ongoing high demand at work.  She reports recent layoffs through her employer, mildly concerned for her home income.  Patient  presents with anxious mood, congruent affect. She  denies suicidal and homicidal ideations. Denies history of suicide attempts, denies history of non suicidal self-harm behavior.  Patient easily  contracts verbally for safety with this Clinical research associate.    Patient has normal speech and behavior.  She  denies auditory and visual hallucinations.  Patient is able to converse coherently with goal-directed thoughts and no distractibility or preoccupation.  Denies symptoms of paranoia.  Objectively there is no evidence of  psychosis/mania or delusional thinking.  Sherry Hansen resides alone in [place], she denies access to weapons. She is employed in [employer]. She endorses rare alcohol use, less than one drink per month. She denies substance use aside from alcohol. Patient endorses average sleep and appetite.   Patient offered support and encouragement.  Briefly reviewed intensive outpatient versus partial hospitalization programs, patient is uncertain if she can secure the time away from work.  She will consider this option.   Patient educated and verbalizes understanding of mental health resources and other crisis services in the community. They are instructed to call 911 and present to the nearest emergency room should patient experience any suicidal/homicidal ideation, auditory/visual/hallucinations, or detrimental worsening of mental health condition.     Psychiatric Specialty Exam  Presentation  General Appearance:Appropriate for Environment; Casual  Eye Contact:Good  Speech:Clear and Coherent; Normal Rate  Speech Volume:Normal  Handedness:Right   Mood and Affect  Mood:Euthymic; Anxious  Affect:Appropriate; Congruent   Thought Process  Thought Processes:Coherent; Goal Directed; Linear  Descriptions of Associations:Intact  Orientation:Full (Time, Place and Person)  Thought Content:Logical; WDL    Hallucinations:None  Ideas of Reference:None  Suicidal Thoughts:No  Homicidal Thoughts:No   Sensorium  Memory:Immediate Good; Recent Good  Judgment:Good  Insight:Good   Executive Functions  Concentration:Good  Attention Span:Good  Recall:Good  Fund of Knowledge:Good  Language:Good   Psychomotor Activity  Psychomotor Activity:Normal   Assets  Assets:Communication Skills; Desire for Improvement; Housing; Health and safety inspector; Intimacy; Leisure Time; Physical Health; Resilience; Social Support   Sleep  Sleep:Good  Number of hours: No data recorded  No data  recorded  Physical Exam: Physical Exam Vitals and nursing note reviewed.  Constitutional:      Appearance: Normal appearance.  She is well-developed.  HENT:     Head: Normocephalic and atraumatic.     Nose: Nose normal.  Cardiovascular:     Rate and Rhythm: Normal rate.  Pulmonary:     Effort: Pulmonary effort is normal.  Musculoskeletal:        General: Normal range of motion.     Cervical back: Normal range of motion.  Skin:    General: Skin is warm and dry.  Neurological:     Mental Status: She is alert and oriented to person, place, and time.  Psychiatric:        Attention and Perception: Attention and perception normal.        Mood and Affect: Affect normal. Mood is anxious.        Speech: Speech normal.        Behavior: Behavior normal. Behavior is cooperative.        Thought Content: Thought content normal.        Cognition and Memory: Cognition and memory normal.        Judgment: Judgment normal.    Review of Systems  Constitutional: Negative.   HENT: Negative.    Eyes: Negative.   Respiratory: Negative.    Cardiovascular: Negative.   Gastrointestinal: Negative.   Genitourinary: Negative.   Musculoskeletal: Negative.   Skin: Negative.   Neurological: Negative.   Psychiatric/Behavioral:  The patient is nervous/anxious.    Blood pressure (!) 140/82, pulse 96, temperature 98.4 F (36.9 C), temperature source Oral, resp. rate 18, height 5\' 9"  (1.753 m), weight 280 lb (127 kg), SpO2 98 %. Body mass index is 41.35 kg/m.  Musculoskeletal: Strength & Muscle Tone: within normal limits Gait & Station: normal Patient leans: N/A   [facility] MSE Discharge Disposition for Follow up and Recommendations: Based on my evaluation the patient does not appear to have an emergency medical condition and can be discharged with resources and follow up care in outpatient services for Medication Management and Individual Therapy Patient reviewed with Dr .  Follow up with  outpatient psychiatry resources.   Nelly Rout, FNP 03/27/2022, 5:30 PM

## 2022-03-27 NOTE — Telephone Encounter (Signed)
Chart supports Rx Last OV: 06/2021 Next OV: not scheduled, pt needs f/u appt for further refills   

## 2022-03-27 NOTE — Discharge Instructions (Addendum)

## 2022-03-27 NOTE — Progress Notes (Signed)
[facility] entered resources in AVS  Neyland Pettengill [facility] 

## 2022-03-30 DIAGNOSIS — F32A Depression, unspecified: Secondary | ICD-10-CM | POA: Diagnosis not present

## 2022-03-31 ENCOUNTER — Other Ambulatory Visit (HOSPITAL_COMMUNITY): Payer: Self-pay

## 2022-04-03 ENCOUNTER — Other Ambulatory Visit (HOSPITAL_COMMUNITY): Payer: Self-pay

## 2022-04-03 DIAGNOSIS — F32A Depression, unspecified: Secondary | ICD-10-CM | POA: Diagnosis not present

## 2022-04-07 ENCOUNTER — Other Ambulatory Visit: Payer: Self-pay | Admitting: Nurse Practitioner

## 2022-04-07 ENCOUNTER — Ambulatory Visit (INDEPENDENT_AMBULATORY_CARE_PROVIDER_SITE_OTHER): Payer: BLUE CROSS/BLUE SHIELD | Admitting: Nurse Practitioner

## 2022-04-07 ENCOUNTER — Encounter: Payer: Self-pay | Admitting: Nurse Practitioner

## 2022-04-07 ENCOUNTER — Other Ambulatory Visit (HOSPITAL_COMMUNITY): Payer: Self-pay

## 2022-04-07 VITALS — BP 120/84 | HR 87 | Temp 97.8°F | Ht 69.0 in | Wt 222.2 lb

## 2022-04-07 DIAGNOSIS — F430 Acute stress reaction: Secondary | ICD-10-CM | POA: Diagnosis not present

## 2022-04-07 DIAGNOSIS — I10 Essential (primary) hypertension: Secondary | ICD-10-CM | POA: Diagnosis not present

## 2022-04-07 DIAGNOSIS — Z780 Asymptomatic menopausal state: Secondary | ICD-10-CM

## 2022-04-07 DIAGNOSIS — F411 Generalized anxiety disorder: Secondary | ICD-10-CM

## 2022-04-07 MED ORDER — VENLAFAXINE HCL ER 150 MG PO CP24
300.0000 mg | ORAL_CAPSULE | Freq: Every day | ORAL | 0 refills | Status: DC
  Filled 2022-04-07 – 2022-04-21 (×2): qty 180, 90d supply, fill #0

## 2022-04-07 MED ORDER — AMLODIPINE BESYLATE 10 MG PO TABS
10.0000 mg | ORAL_TABLET | Freq: Every day | ORAL | 1 refills | Status: DC
  Filled 2022-04-07 – 2022-04-21 (×2): qty 90, 90d supply, fill #0

## 2022-04-07 MED ORDER — ALPRAZOLAM 0.25 MG PO TABS
0.2500 mg | ORAL_TABLET | Freq: Two times a day (BID) | ORAL | 0 refills | Status: AC | PRN
  Filled 2022-04-07: qty 14, 7d supply, fill #0

## 2022-04-07 MED ORDER — BUSPIRONE HCL 30 MG PO TABS
45.0000 mg | ORAL_TABLET | Freq: Two times a day (BID) | ORAL | 0 refills | Status: AC
  Filled 2022-04-07: qty 90, 30d supply, fill #0
  Filled 2022-04-08 – 2022-05-04 (×2): qty 90, 30d supply, fill #1

## 2022-04-07 NOTE — Assessment & Plan Note (Signed)
Increased anxiety due to work stress and father's declining health (diagnosed with myasthenia gravis and admitted in hospital). She plan to go see him this weekend. This led to acute visit at [facility] clinic. She is scheduled to see Dr. Everlena Cooper (psychiatry) next week. She also has CBT session once a week. No HI/SI or hallucinations.  Maintain effexor and buspar dose Sent alprazolam 0.25mg  BID prn.  advised about risk of sedation and dependence

## 2022-04-07 NOTE — Assessment & Plan Note (Signed)
Stable BP Readings from Last 3 Encounters:  04/07/22 120/84  06/25/21 134/90  12/04/20 130/90   med refill sent

## 2022-04-07 NOTE — Patient Instructions (Addendum)
Sign medical release form to get previous PAP smear and colonoscopy. Maintain appt with psychiatry.

## 2022-04-07 NOTE — Progress Notes (Signed)
Established Patient Visit  Patient: Sherry Hansen   DOB: 1966-06-04   56 y.o. Female  MRN: [id number] Visit Date: 04/07/2022  Subjective:    Chief Complaint  Patient presents with   Acute Visit    Panic attacks x 10 days  Has appt w/ psychiatrist next week    HPI Essential hypertension Stable BP Readings from Last 3 Encounters:  04/07/22 120/84  06/25/21 134/90  12/04/20 130/90   med refill sent  GAD (generalized anxiety disorder) Increased anxiety due to work stress and father's declining health (diagnosed with myasthenia gravis and admitted in hospital). She plan to go see him this weekend. This led to acute visit at [facility] clinic. She is scheduled to see Dr. Everlena Cooper (psychiatry) next week. She also has CBT session once a week. No HI/SI or hallucinations.  Maintain effexor and buspar dose Sent alprazolam 0.25mg  BID prn.  advised about risk of sedation and dependence      04/07/2022   11:42 AM 06/25/2021    2:17 PM  Depression screen PHQ 2/9  Decreased Interest 2 0  Down, Depressed, Hopeless 2 0  PHQ - 2 Score 4 0  Altered sleeping 1 0  Tired, decreased energy 2 0  Change in appetite 3 0  Feeling bad or failure about yourself  1 0  Trouble concentrating 3 0  Moving slowly or fidgety/restless 2 0  Suicidal thoughts 0 0  PHQ-9 Score 16 0  Difficult doing work/chores Very difficult        04/07/2022   11:42 AM 06/25/2021    2:17 PM  GAD 7 : Generalized Anxiety Score  Nervous, Anxious, on Edge 3 0  Control/stop worrying 3 0  Worry too much - different things 3 0  Trouble relaxing 3 0  Restless 2 0  Easily annoyed or irritable 1 0  Afraid - awful might happen 2 0  Total GAD 7 Score 17 0  Anxiety Difficulty Very difficult    Reviewed medical, surgical, and social history today  Medications: Outpatient Medications Prior to Visit  Medication Sig   estradiol (CLIMARA - DOSED IN MG/24 HR) 0.1 mg/24hr patch Place 1 patch (0.1 mg  total) onto the skin once a week.   loratadine (CLARITIN) 10 MG tablet Take 10 mg by mouth daily.   [DISCONTINUED] amLODipine (NORVASC) 10 MG tablet Take 1 tablet (10 mg total) by mouth daily.   [DISCONTINUED] busPIRone (BUSPAR) 30 MG tablet Take 1 and 1/2 tablets (45 mg total) by mouth in the morning and at bedtime.   [DISCONTINUED] venlafaxine XR (EFFEXOR-XR) 150 MG 24 hr capsule Take 2 capsules (300 mg total) by mouth daily.   [DISCONTINUED] chlorhexidine (HIBICLENS) 4 % external liquid Apply topically daily as needed. (Patient not taking: Reported on 04/07/2022)   [DISCONTINUED] doxycycline (VIBRA-TABS) 100 MG tablet Take 1 tablet (100 mg total) by mouth 2 (two) times daily. (Patient not taking: Reported on 04/07/2022)   [DISCONTINUED] nystatin cream (MYCOSTATIN) Apply to affected area 2 times daily (Patient not taking: Reported on 04/07/2022)   [DISCONTINUED] triamcinolone cream (KENALOG) 0.1 % Apply 1 application topically 2 (two) times daily. (Patient not taking: Reported on 04/07/2022)   [DISCONTINUED] Venlafaxine HCl 150 MG TB24 Take 2 tablets (300 mg total) by mouth daily. (Patient not taking: Reported on 04/07/2022)   No facility-administered medications prior to visit.   Reviewed past medical and social history.   ROS  per HPI above      Objective:  BP 120/84 (BP Location: Left Arm, Patient Position: Sitting, Cuff Size: Normal)   Pulse 87   Temp 97.8 F (36.6 C) (Temporal)   Ht 5\' 9"  (1.753 m)   Wt 222 lb 3.2 oz (100.8 kg)   SpO2 98%   BMI 32.81 kg/m      Physical Exam Vitals reviewed.  Neurological:     Mental Status: She is alert.  Psychiatric:        Mood and Affect: Mood is anxious. Affect is tearful.        Speech: Speech normal.        Behavior: Behavior is cooperative.        Thought Content: Thought content normal.        Cognition and Memory: Cognition and memory normal.        Judgment: Judgment normal.     No results found for any visits on 04/07/22.     Assessment & Plan:    Problem List Items Addressed This Visit       Cardiovascular and Mediastinum   Essential hypertension    Stable BP Readings from Last 3 Encounters:  04/07/22 120/84  06/25/21 134/90  12/04/20 130/90   med refill sent      Relevant Medications   amLODipine (NORVASC) 10 MG tablet     Other   GAD (generalized anxiety disorder) - Primary    Increased anxiety due to work stress and father's declining health (diagnosed with myasthenia gravis and admitted in hospital). She plan to go see him this weekend. This led to acute visit at [facility] clinic. She is scheduled to see [facility] (psychiatry) next week. She also has CBT session once a week. No HI/SI or hallucinations.  Maintain effexor and buspar dose Sent alprazolam 0.25mg  BID prn.  advised about risk of sedation and dependence      Relevant Medications   ALPRAZolam (XANAX) 0.25 MG tablet   venlafaxine XR (EFFEXOR-XR) 150 MG 24 hr capsule   busPIRone (BUSPAR) 30 MG tablet   Other Visit Diagnoses     Stress reaction causing mixed disturbance of emotion and conduct       Relevant Medications   ALPRAZolam (XANAX) 0.25 MG tablet   venlafaxine XR (EFFEXOR-XR) 150 MG 24 hr capsule   busPIRone (BUSPAR) 30 MG tablet      Return in about 3 months (around 07/08/2022) for CPE (fasting).     07/10/2022, NP

## 2022-04-08 ENCOUNTER — Other Ambulatory Visit (HOSPITAL_COMMUNITY): Payer: Self-pay

## 2022-04-08 MED ORDER — ESTRADIOL 0.1 MG/24HR TD PTWK
0.1000 mg | MEDICATED_PATCH | TRANSDERMAL | 0 refills | Status: AC
  Filled 2022-04-08 – 2022-04-21 (×2): qty 4, 28d supply, fill #0

## 2022-04-10 DIAGNOSIS — F32A Depression, unspecified: Secondary | ICD-10-CM | POA: Diagnosis not present

## 2022-04-14 ENCOUNTER — Ambulatory Visit (HOSPITAL_COMMUNITY): Payer: BLUE CROSS/BLUE SHIELD | Admitting: Psychiatry

## 2022-04-21 ENCOUNTER — Other Ambulatory Visit (HOSPITAL_COMMUNITY): Payer: Self-pay

## 2022-04-21 DIAGNOSIS — F32A Depression, unspecified: Secondary | ICD-10-CM | POA: Diagnosis not present

## 2022-04-22 ENCOUNTER — Other Ambulatory Visit (HOSPITAL_COMMUNITY): Payer: Self-pay

## 2022-04-23 DIAGNOSIS — N951 Menopausal and female climacteric states: Secondary | ICD-10-CM | POA: Diagnosis not present

## 2022-04-29 ENCOUNTER — Other Ambulatory Visit (HOSPITAL_COMMUNITY): Payer: Self-pay

## 2022-04-29 MED ORDER — ESTRADIOL 0.05 MG/24HR TD PTTW
MEDICATED_PATCH | TRANSDERMAL | 0 refills | Status: AC
  Filled 2022-04-29: qty 8, 28d supply, fill #0

## 2022-05-04 ENCOUNTER — Other Ambulatory Visit (HOSPITAL_COMMUNITY): Payer: Self-pay

## 2022-05-04 DIAGNOSIS — Z1389 Encounter for screening for other disorder: Secondary | ICD-10-CM | POA: Diagnosis not present

## 2022-05-04 DIAGNOSIS — E7800 Pure hypercholesterolemia, unspecified: Secondary | ICD-10-CM | POA: Diagnosis not present

## 2022-05-04 DIAGNOSIS — Z0000 Encounter for general adult medical examination without abnormal findings: Secondary | ICD-10-CM | POA: Diagnosis not present

## 2022-05-04 DIAGNOSIS — Z1211 Encounter for screening for malignant neoplasm of colon: Secondary | ICD-10-CM | POA: Diagnosis not present

## 2022-05-04 DIAGNOSIS — Z131 Encounter for screening for diabetes mellitus: Secondary | ICD-10-CM | POA: Diagnosis not present

## 2022-05-04 DIAGNOSIS — I10 Essential (primary) hypertension: Secondary | ICD-10-CM | POA: Diagnosis not present

## 2022-05-04 DIAGNOSIS — K219 Gastro-esophageal reflux disease without esophagitis: Secondary | ICD-10-CM | POA: Diagnosis not present

## 2022-05-05 ENCOUNTER — Other Ambulatory Visit (HOSPITAL_COMMUNITY): Payer: Self-pay

## 2022-05-05 MED ORDER — BUSPIRONE HCL 30 MG PO TABS: 45.0000 mg | ORAL_TABLET | Freq: Two times a day (BID) | ORAL | 0 refills | Status: AC

## 2022-05-05 MED ORDER — VENLAFAXINE HCL ER 150 MG PO CP24
300.0000 mg | ORAL_CAPSULE | Freq: Every day | ORAL | 0 refills | Status: AC
  Filled 2022-05-05: qty 60, 30d supply, fill #0

## 2022-05-08 DIAGNOSIS — F32A Depression, unspecified: Secondary | ICD-10-CM | POA: Diagnosis not present

## 2022-05-15 ENCOUNTER — Other Ambulatory Visit (HOSPITAL_COMMUNITY): Payer: Self-pay

## 2022-05-22 ENCOUNTER — Other Ambulatory Visit (HOSPITAL_COMMUNITY): Payer: Self-pay

## 2022-07-16 ENCOUNTER — Other Ambulatory Visit (HOSPITAL_COMMUNITY): Payer: Self-pay

## 2022-08-26 ENCOUNTER — Other Ambulatory Visit: Payer: Self-pay | Admitting: Family Medicine

## 2022-08-26 DIAGNOSIS — I10 Essential (primary) hypertension: Secondary | ICD-10-CM

## 2023-01-18 ENCOUNTER — Other Ambulatory Visit: Payer: Self-pay | Admitting: Nurse Practitioner

## 2023-01-18 DIAGNOSIS — I10 Essential (primary) hypertension: Secondary | ICD-10-CM
